# Patient Record
Sex: Female | Born: 1945 | Race: Black or African American | Hispanic: No | Marital: Married | Smoking: Never smoker
Health system: Southern US, Community
[De-identification: ages and names within clinical notes are randomized; demographics above are authoritative.]

## PROBLEM LIST (undated history)

## (undated) ENCOUNTER — Encounter: Attending: Hematology & Oncology | Primary: Hematology & Oncology

## (undated) ENCOUNTER — Ambulatory Visit: Payer: MEDICARE

## (undated) ENCOUNTER — Ambulatory Visit: Payer: MEDICARE | Attending: Hematology & Oncology | Primary: Hematology & Oncology

## (undated) ENCOUNTER — Telehealth

## (undated) ENCOUNTER — Encounter

## (undated) ENCOUNTER — Ambulatory Visit: Payer: MEDICARE | Attending: Nurse Practitioner | Primary: Nurse Practitioner

## (undated) ENCOUNTER — Encounter: Attending: Nurse Practitioner | Primary: Nurse Practitioner

## (undated) ENCOUNTER — Encounter
Attending: Student in an Organized Health Care Education/Training Program | Primary: Student in an Organized Health Care Education/Training Program

## (undated) ENCOUNTER — Ambulatory Visit: Payer: Medicare (Managed Care)

## (undated) ENCOUNTER — Ambulatory Visit

## (undated) ENCOUNTER — Telehealth: Attending: Hematology & Oncology | Primary: Hematology & Oncology

## (undated) ENCOUNTER — Encounter: Attending: Adult Health | Primary: Adult Health

## (undated) ENCOUNTER — Ambulatory Visit
Payer: MEDICARE | Attending: Student in an Organized Health Care Education/Training Program | Primary: Student in an Organized Health Care Education/Training Program

## (undated) ENCOUNTER — Ambulatory Visit: Payer: MEDICARE | Attending: Adult Health | Primary: Adult Health

## (undated) ENCOUNTER — Ambulatory Visit: Attending: Physician Assistant | Primary: Physician Assistant

## (undated) DIAGNOSIS — G8929 Other chronic pain: Secondary | ICD-10-CM

## (undated) DIAGNOSIS — E785 Hyperlipidemia, unspecified: Secondary | ICD-10-CM

## (undated) DIAGNOSIS — R42 Dizziness and giddiness: Secondary | ICD-10-CM

## (undated) DIAGNOSIS — K219 Gastro-esophageal reflux disease without esophagitis: Secondary | ICD-10-CM

## (undated) DIAGNOSIS — R002 Palpitations: Secondary | ICD-10-CM

## (undated) DIAGNOSIS — G2581 Restless legs syndrome: Secondary | ICD-10-CM

## (undated) DIAGNOSIS — I499 Cardiac arrhythmia, unspecified: Secondary | ICD-10-CM

## (undated) DIAGNOSIS — G629 Polyneuropathy, unspecified: Secondary | ICD-10-CM

## (undated) DIAGNOSIS — N3281 Overactive bladder: Secondary | ICD-10-CM

## (undated) DIAGNOSIS — I341 Nonrheumatic mitral (valve) prolapse: Secondary | ICD-10-CM

## (undated) DIAGNOSIS — G5600 Carpal tunnel syndrome, unspecified upper limb: Secondary | ICD-10-CM

## (undated) DIAGNOSIS — M5430 Sciatica, unspecified side: Secondary | ICD-10-CM

## (undated) DIAGNOSIS — I4891 Unspecified atrial fibrillation: Secondary | ICD-10-CM

## (undated) DIAGNOSIS — M549 Dorsalgia, unspecified: Secondary | ICD-10-CM

## (undated) HISTORY — DX: Palpitations: R00.2

## (undated) HISTORY — DX: Dizziness and giddiness: R42

## (undated) HISTORY — PX: CATARACT EXTRACTION: SUR2

## (undated) HISTORY — DX: Hyperlipidemia, unspecified: E78.5

## (undated) HISTORY — DX: Restless legs syndrome: G25.81

## (undated) HISTORY — DX: Overactive bladder: N32.81

## (undated) HISTORY — DX: Nonrheumatic mitral (valve) prolapse: I34.1

## (undated) HISTORY — DX: Polyneuropathy, unspecified: G62.9

## (undated) HISTORY — DX: Unspecified atrial fibrillation: I48.91

## (undated) HISTORY — DX: Other chronic pain: G89.29

## (undated) HISTORY — DX: Carpal tunnel syndrome, unspecified upper limb: G56.00

## (undated) HISTORY — DX: Sciatica, unspecified side: M54.30

## (undated) HISTORY — DX: Dorsalgia, unspecified: M54.9

## (undated) HISTORY — DX: Gastro-esophageal reflux disease without esophagitis: K21.9

---

## 1997-09-20 ENCOUNTER — Ambulatory Visit (HOSPITAL_COMMUNITY): Admission: RE | Admit: 1997-09-20 | Discharge: 1997-09-20 | Payer: Self-pay | Admitting: *Deleted

## 1997-09-21 ENCOUNTER — Ambulatory Visit (HOSPITAL_COMMUNITY): Admission: RE | Admit: 1997-09-21 | Discharge: 1997-09-21 | Payer: Self-pay | Admitting: *Deleted

## 1998-02-12 ENCOUNTER — Ambulatory Visit (HOSPITAL_COMMUNITY): Admission: RE | Admit: 1998-02-12 | Discharge: 1998-02-12 | Payer: Self-pay | Admitting: *Deleted

## 1998-02-13 ENCOUNTER — Ambulatory Visit (HOSPITAL_COMMUNITY): Admission: RE | Admit: 1998-02-13 | Discharge: 1998-02-13 | Payer: Self-pay | Admitting: *Deleted

## 1998-02-21 ENCOUNTER — Ambulatory Visit (HOSPITAL_COMMUNITY): Admission: RE | Admit: 1998-02-21 | Discharge: 1998-02-21 | Payer: Self-pay | Admitting: *Deleted

## 1999-09-25 ENCOUNTER — Ambulatory Visit (HOSPITAL_COMMUNITY): Admission: RE | Admit: 1999-09-25 | Discharge: 1999-09-25 | Payer: Self-pay | Admitting: *Deleted

## 1999-11-11 ENCOUNTER — Encounter: Payer: Self-pay | Admitting: *Deleted

## 1999-11-11 ENCOUNTER — Ambulatory Visit (HOSPITAL_COMMUNITY): Admission: RE | Admit: 1999-11-11 | Discharge: 1999-11-11 | Payer: Self-pay | Admitting: *Deleted

## 2000-01-07 ENCOUNTER — Other Ambulatory Visit: Admission: RE | Admit: 2000-01-07 | Discharge: 2000-01-07 | Payer: Self-pay | Admitting: Obstetrics and Gynecology

## 2000-06-10 ENCOUNTER — Encounter: Payer: Self-pay | Admitting: *Deleted

## 2000-06-10 ENCOUNTER — Ambulatory Visit (HOSPITAL_COMMUNITY): Admission: RE | Admit: 2000-06-10 | Discharge: 2000-06-10 | Payer: Self-pay | Admitting: *Deleted

## 2000-11-12 ENCOUNTER — Encounter: Payer: Self-pay | Admitting: *Deleted

## 2000-11-12 ENCOUNTER — Ambulatory Visit (HOSPITAL_COMMUNITY): Admission: RE | Admit: 2000-11-12 | Discharge: 2000-11-12 | Payer: Self-pay | Admitting: *Deleted

## 2001-03-10 ENCOUNTER — Other Ambulatory Visit: Admission: RE | Admit: 2001-03-10 | Discharge: 2001-03-10 | Payer: Self-pay | Admitting: Obstetrics and Gynecology

## 2001-05-10 ENCOUNTER — Ambulatory Visit (HOSPITAL_COMMUNITY): Admission: AD | Admit: 2001-05-10 | Discharge: 2001-05-10 | Payer: Self-pay | Admitting: *Deleted

## 2001-05-10 ENCOUNTER — Encounter: Payer: Self-pay | Admitting: *Deleted

## 2002-03-22 ENCOUNTER — Other Ambulatory Visit: Admission: RE | Admit: 2002-03-22 | Discharge: 2002-03-22 | Payer: Self-pay | Admitting: Obstetrics and Gynecology

## 2002-04-10 ENCOUNTER — Ambulatory Visit (HOSPITAL_COMMUNITY): Admission: RE | Admit: 2002-04-10 | Discharge: 2002-04-10 | Payer: Self-pay | Admitting: *Deleted

## 2002-04-10 ENCOUNTER — Encounter: Payer: Self-pay | Admitting: *Deleted

## 2003-01-08 ENCOUNTER — Other Ambulatory Visit: Admission: RE | Admit: 2003-01-08 | Discharge: 2003-01-08 | Payer: Self-pay | Admitting: Obstetrics and Gynecology

## 2004-02-04 ENCOUNTER — Other Ambulatory Visit: Admission: RE | Admit: 2004-02-04 | Discharge: 2004-02-04 | Payer: Self-pay | Admitting: Obstetrics and Gynecology

## 2004-06-03 ENCOUNTER — Ambulatory Visit: Admission: RE | Admit: 2004-06-03 | Discharge: 2004-06-03 | Payer: Self-pay | Admitting: Family Medicine

## 2005-02-09 ENCOUNTER — Other Ambulatory Visit: Admission: RE | Admit: 2005-02-09 | Discharge: 2005-02-09 | Payer: Self-pay | Admitting: Obstetrics and Gynecology

## 2006-02-18 ENCOUNTER — Other Ambulatory Visit: Admission: RE | Admit: 2006-02-18 | Discharge: 2006-02-18 | Payer: Self-pay | Admitting: Obstetrics and Gynecology

## 2009-04-05 ENCOUNTER — Encounter (INDEPENDENT_AMBULATORY_CARE_PROVIDER_SITE_OTHER): Payer: Self-pay | Admitting: Obstetrics and Gynecology

## 2009-04-05 ENCOUNTER — Ambulatory Visit (HOSPITAL_COMMUNITY): Admission: RE | Admit: 2009-04-05 | Discharge: 2009-04-05 | Payer: Self-pay | Admitting: Obstetrics and Gynecology

## 2009-10-14 ENCOUNTER — Emergency Department (HOSPITAL_COMMUNITY): Admission: EM | Admit: 2009-10-14 | Discharge: 2009-10-14 | Payer: Self-pay | Admitting: Emergency Medicine

## 2010-08-03 ENCOUNTER — Encounter: Payer: Self-pay | Admitting: Specialist

## 2010-10-17 LAB — CBC
HCT: 37.1 % (ref 36.0–46.0)
Hemoglobin: 12.7 g/dL (ref 12.0–15.0)
MCHC: 34.2 g/dL (ref 30.0–36.0)
MCV: 98.7 fL (ref 78.0–100.0)
Platelets: 199 10*3/uL (ref 150–400)
RBC: 3.76 MIL/uL — ABNORMAL LOW (ref 3.87–5.11)
RDW: 12.4 % (ref 11.5–15.5)
WBC: 4.5 10*3/uL (ref 4.0–10.5)

## 2010-11-19 ENCOUNTER — Other Ambulatory Visit: Payer: Self-pay | Admitting: Gastroenterology

## 2010-11-19 DIAGNOSIS — K219 Gastro-esophageal reflux disease without esophagitis: Secondary | ICD-10-CM

## 2010-11-19 DIAGNOSIS — R05 Cough: Secondary | ICD-10-CM

## 2010-11-24 ENCOUNTER — Other Ambulatory Visit: Payer: Self-pay

## 2011-04-13 HISTORY — PX: ATRIAL FLUTTER ABLATION: SHX5733

## 2011-05-05 ENCOUNTER — Emergency Department (HOSPITAL_COMMUNITY): Payer: Medicare Other

## 2011-05-05 ENCOUNTER — Inpatient Hospital Stay (HOSPITAL_COMMUNITY)
Admission: EM | Admit: 2011-05-05 | Discharge: 2011-05-10 | DRG: 251 | Disposition: A | Discharge status: Home / Self Care | Payer: Medicare Other | Attending: Cardiology | Admitting: Cardiology

## 2011-05-05 DIAGNOSIS — R002 Palpitations: Secondary | ICD-10-CM

## 2011-05-05 DIAGNOSIS — R5383 Other fatigue: Secondary | ICD-10-CM

## 2011-05-05 DIAGNOSIS — K219 Gastro-esophageal reflux disease without esophagitis: Secondary | ICD-10-CM | POA: Diagnosis present

## 2011-05-05 DIAGNOSIS — I4892 Unspecified atrial flutter: Principal | ICD-10-CM | POA: Diagnosis present

## 2011-05-05 DIAGNOSIS — Z7901 Long term (current) use of anticoagulants: Secondary | ICD-10-CM

## 2011-05-05 DIAGNOSIS — R5381 Other malaise: Secondary | ICD-10-CM

## 2011-05-05 LAB — BASIC METABOLIC PANEL
Calcium: 9.7 mg/dL (ref 8.4–10.5)
GFR calc Af Amer: 78 mL/min — ABNORMAL LOW (ref >90)
GFR calc non Af Amer: 67 mL/min — ABNORMAL LOW (ref >90)
Sodium: 137 mEq/L (ref 135–145)

## 2011-05-05 LAB — CBC
HCT: 34.6 % — ABNORMAL LOW (ref 36.0–46.0)
Hemoglobin: 12 g/dL (ref 12.0–15.0)
MCH: 33 pg (ref 26.0–34.0)
MCV: 95.1 fL (ref 78.0–100.0)
Platelets: 220 10*3/uL (ref 150–400)
RBC: 3.64 MIL/uL — ABNORMAL LOW (ref 3.87–5.11)

## 2011-05-05 LAB — URINALYSIS, ROUTINE W REFLEX MICROSCOPIC
Leukocytes, UA: NEGATIVE
Nitrite: NEGATIVE
Protein, ur: NEGATIVE mg/dL
Urobilinogen, UA: 0.2 mg/dL (ref 0.0–1.0)

## 2011-05-05 LAB — DIFFERENTIAL
Eosinophils Absolute: 0.1 10*3/uL (ref 0.0–0.7)
Lymphocytes Relative: 40 % (ref 12–46)
Lymphs Abs: 2.6 10*3/uL (ref 0.7–4.0)
Monocytes Relative: 8 % (ref 3–12)
Neutro Abs: 3.2 10*3/uL (ref 1.7–7.7)

## 2011-05-05 LAB — POCT I-STAT TROPONIN I: Troponin i, poc: 0 ng/mL (ref 0.00–0.08)

## 2011-05-05 MED ORDER — IOHEXOL 300 MG/ML  SOLN
100.0000 mL | Freq: Once | INTRAMUSCULAR | Status: AC | PRN
  Administered 2011-05-05: 100 mL via INTRAVENOUS

## 2011-05-06 DIAGNOSIS — I517 Cardiomegaly: Secondary | ICD-10-CM

## 2011-05-06 LAB — URINE CULTURE

## 2011-05-06 NOTE — H&P (Signed)
NAME:  Nancy Kline, Nancy Kline NO.:  [id number]  MEDICAL RECORD NO.:  [id number]  LOCATION:  [facility]                         FACILITY:  [facility]  PHYSICIAN:  Zacarias Pontes, MD       DATE OF BIRTH:  1945-11-07  DATE OF ADMISSION:  05/06/2011 DATE OF DISCHARGE:                             HISTORY & PHYSICAL   PRIMARY CARDIOLOGIST:  None established.  CHIEF COMPLAINT:  Palpitations and weakness.  HISTORY OF PRESENT ILLNESS:  Nancy Kline is a 65 year old woman with a reported history of atrial fibrillation in the remote past and mitral valve prolapse, who presents with 2-3 days of fatigue, exertional dyspnea, and palpitations.  Beginning Sunday, she began to feel more tired and breathless than usual with a few episodes of presyncope including early yesterday morning while ambulating to the bathroom.  She has generally felt fatigued and unable to exert herself.  She denies chest tightness, denies nausea, denies diaphoresis.  She has not had any fevers and denies any sick contacts.  She denies a cough productive or otherwise.  She denies any recent lower extremity swelling, pain, or erythema and has not had any recent trauma or surgery to Nancy Kline lower extremities.  She has also not been particularly sedentary preceding the onset of Nancy Kline symptoms.  PAST MEDICAL HISTORY: 1. Chronic back pain and sciatica. 2. Atrial fibrillation episode in the past, though she is not     chronically on anticoagulation. 3. Reported history of mitral valve prolapse.  SOCIAL HISTORY:  She is a lifelong nonsmoker.  She lives with Nancy Kline husband who accompanies Nancy Kline in the emergency room today.  The patient's daughter is a Teacher, early years/pre who I spoke with on the phone from the emergency room.  FAMILY HISTORY:  Noncontributory.  REVIEW OF SYSTEMS:  As per HPI otherwise is comprehensively negative.  ALLERGIES:  DARVOCET, LYRICA, CELEBREX, FLEXERIL.  MEDICATIONS: 1. Prilosec 20 mg daily. 2. Meloxicam 15 mg  daily. 3. Tramadol 50 mg daily.  PHYSICAL EXAMINATION:  VITAL SIGNS:  On physical exam, the patient has a heart rate of 85-95 with a blood pressure of 118/83, and respiratory rate of 16, oxygen saturations are 100% on room air. GENERAL:  She is in no acute distress and is not using any accessory muscles to assist Nancy Kline breathing. HEENT:  Notable for a supple neck with no masses or lymphadenopathy. Nancy Kline JVP is normal. CARDIAC:  Notable for a normal S1 and S2 with a regular rate and no murmurs, rubs, or gallops appreciated. LUNGS:  Clear to auscultation bilaterally with no crackles or wheezing appreciated. ABDOMEN:  Soft, nontender, nondistended with positive bowel sounds and no abdominal bruits. EXTREMITIES:  Warm and well perfused with no lower extremity edema.  DP pulses are 2+ bilaterally. NEUROLOGIC:  She is alert and oriented x3 with no focal neurologic deficits.  LABORATORY EVALUATION:  Nancy Kline white count is 6, Nancy Kline hematocrit is 34, platelets are 220,000.  Sodium 137, potassium 3.9, chloride 100, bicarb 28, BUN 14, creatinine 0.8 with a glucose of 102.  Nancy Kline calcium is 9.7, and Nancy Kline troponin is 0.  Nancy Kline urinalysis performed is negative.  A D-dimer was slightly elevated at 0.92.  INR  1.1.  An ECG demonstrates atrial flutter with variable block.  The flutter appears to be typical in nature with classic saw-tooth morphology in the inferior leads.  A CT of the chest with IV contrast was performed in the ER this evening with no evidence of pulmonary embolism.  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  PLANS:  We will admit Nancy Kline to telemetry for further evaluation and management.  She has been placed on enoxaparin at 1 mg/kg subcu every 12 hours for thromboembolic protection in the setting of atrial flutter. She will have a transthoracic echocardiogram in the morning to  assess Nancy Kline heart structure, Nancy Kline left atrial size, and Nancy Kline mitral valve.  I have initiated a low-dose beta-blocker for now.  I think EP could ablate Nancy Kline atrial flutter given its morphology.  A less definitive alternative would be to cardiovert Nancy Kline electively during the day tomorrow.  Either way, given the duration of Nancy Kline symptoms and the unknown period of time during which she has been in atrial flutter, she would likely need a transesophageal echocardiogram to evaluate Nancy Kline left atrial appendage prior to proceeding with either of these options.  Of note, the patient is very anxious about the possibility of any procedures and I explained to Nancy Kline the sequence and rationale for what we are going to do. We will further discuss Nancy Kline options with Nancy Kline in the light of day tomorrow.  I explained the plan to the patient and to Nancy Kline husband.  Both of them voiced understanding.  Questions were answered to the best of my ability.          ______________________________ Zacarias Pontes, MD     DM/MEDQ  D:  05/06/2011  T:  05/06/2011  Job:  [id number]  Electronically Signed by Zacarias Pontes MD on 05/06/2011 08:06:25 AM

## 2011-05-07 DIAGNOSIS — I4892 Unspecified atrial flutter: Secondary | ICD-10-CM

## 2011-05-08 LAB — GLUCOSE, CAPILLARY: Glucose-Capillary: 116 mg/dL — ABNORMAL HIGH (ref 70–99)

## 2011-05-08 LAB — BASIC METABOLIC PANEL
BUN: 14 mg/dL (ref 6–23)
Chloride: 102 mEq/L (ref 96–112)
GFR calc Af Amer: 81 mL/min — ABNORMAL LOW (ref >90)
Potassium: 4.2 mEq/L (ref 3.5–5.1)

## 2011-05-08 LAB — PROTIME-INR
INR: 1.35 (ref 0.00–1.49)
Prothrombin Time: 16.9 seconds — ABNORMAL HIGH (ref 11.6–15.2)

## 2011-05-08 LAB — DIFFERENTIAL
Basophils Absolute: 0 10*3/uL (ref 0.0–0.1)
Eosinophils Relative: 2 % (ref 0–5)
Lymphocytes Relative: 31 % (ref 12–46)

## 2011-05-08 LAB — CBC
HCT: 34.2 % — ABNORMAL LOW (ref 36.0–46.0)
Platelets: 208 10*3/uL (ref 150–400)
RDW: 12.1 % (ref 11.5–15.5)
WBC: 4.9 10*3/uL (ref 4.0–10.5)

## 2011-05-09 DIAGNOSIS — I4891 Unspecified atrial fibrillation: Secondary | ICD-10-CM

## 2011-05-09 LAB — PROTIME-INR
INR: 1.61 — ABNORMAL HIGH (ref 0.00–1.49)
Prothrombin Time: 19.4 seconds — ABNORMAL HIGH (ref 11.6–15.2)

## 2011-05-10 NOTE — Op Note (Signed)
NAMEDAYLA, GASCA NO.:  [id number]  MEDICAL RECORD NO.:  [id number]  LOCATION:  3742                         FACILITY:  [facility]  PHYSICIAN:  Doylene Canning. Ladona Ridgel, MD    DATE OF BIRTH:  1946/05/09  DATE OF PROCEDURE:  05/08/2011 DATE OF DISCHARGE:                              OPERATIVE REPORT   PROCEDURES PERFORMED: 1. Electrophysiologic study. 2. RF catheter ablation of atrial flutter.  INTRODUCTION:  The patient is a very pleasant 65 year old woman who has a history of palpitations in the past.  She presented to the hospital with atrial flutter and rapid ventricular response.  She underwent initiation of Coumadin.  She was placed on Lovenox.  She underwent transesophageal echo, demonstrating known left atrial appendage thrombus.  She is now referred for catheter ablation of atrial flutter.  DESCRIPTION OF PROCEDURE:  After informed consent was obtained, the patient was taken to the Diagnostic EP Lab in the fasting state.  The patient had previously returned to sinus rhythm approximately 12 hours prior to the procedure.  A 6-French Octapolar catheter was inserted percutaneously into the right femoral vein and advanced to the coronary sinus.  A 6-French quadripolar catheter was inserted percutaneously into the right femoral vein and advanced to the His bundle region.  At this point, programmed atrial stimulation was carried out from the coronary sinus and stepwise decreased down to 500/310, resulting in AV node ERP. During programmed atrial stimulation, there were multiple AH jumps, no echo beats, and no inducible SVT.  Next, rapid atrial pacing was carried out from the coronary sinus at a base drive cycle length of 161 msec and stepwise decreased down to 430 msec where AV Wenckebach was demonstrated.  During rapid atrial pacing, the PR interval was equal to the RR interval.  The patient had a cycle length of 200 msec, resulted in the initiation of atrial  flutter.  Mapping was then carried out. This was typical counterclockwise tricuspid annular reentry.  Lead V1 was positive.  Inferior leads were negative in a saw tooth pattern.  A 7- French quadripolar ablation catheter was then maneuvered by way of the right femoral vein into the atrial flutter isthmus.  A total of 10 RF energy applications were delivered.  During the second RF energy application, atrial flutter was terminated, and sinus rhythm restored. Pacing was then carried out from the coronary sinus.  Seven additional RF energy applications were delivered, resulting in the creation of the atrial flutter isthmus block.  Single bonus RF energy application was delivered and the patient was observed for approximately 30 minutes. During this time, there is no atrial flutter isthmus conduction.  During this time, the ablation catheter was maneuvered into the right ventricle and rapid ventricular pacing was carried out from the right ventricle, demonstrating VA Wenckebach at 600 milliseconds.  Programmed ventricular stimulation was carried out at a base drive cycle length of 096 milliseconds.  The S1 and S2 interval stepwise decreased down to 490 milliseconds where the retrograde AV node ERP was demonstrated.  During programmed ventricular stimulation, there was no inducible VT.  There was no inducible SVT.  At this point, the catheters were removed.  Hemostasis was assured and the patient was returned to her room in satisfactory condition.  COMPLICATIONS:  There were no immediate procedure complications.  RESULTS:  A.  Baseline ECG demonstrates sinus rhythm with normal axis and intervals. B.  Baseline intervals.  Sinus node cycle length was 840 milliseconds, the HV interval was 34 milliseconds, the QRS duration was 74 milliseconds, the AH interval was 69 milliseconds.  Following ablation, there was no significant change. C.  Rapid ventricular pacing.  Following ablation of rapid  ventricular pacing, demonstrated a VA Wenckebach cycle length of 600 milliseconds. During rapid ventricular pacing, the atrial activation was midline and decremental. D.  Programmed ventricular stimulation.  Programmed ventricular stimulation was carried out from the right ventricle at base drive cycle length of 409 milliseconds.  The S1 and S2 interval stepwise decreased down to 490 milliseconds, resulting in the retrograde AV node ERP. During programmed ventricular stimulation, there are no inducible VT or SVT. E.  Rapid atrial pacing.  Rapid atrial pacing was carried out from the coronary sinus, demonstrating AV Wenckebach cycle length of 430 milliseconds. F.  Programmed atrial stimulation.  Programmed atrial stimulation was carried out from the coronary sinus at base drive cycle length of 811 milliseconds.  The S1-S2 interval stepwise decreased down to 310 milliseconds where the AV node ERP was observed.  During programmed atrial stimulation, there was multiple AH jumps, no echo beats, and no inducible SVT. G.  Arrhythmias observed. 1. Atrial flutter.  Initiation was with rapid atrial pacing.  Duration     was sustained.  Termination was with catheter ablation.  Cycle     length was 207 milliseconds.     a.     Mapping.  Mapping of atrial flutter isthmus demonstrated      usual size and orientation.     b.     RF energy application.  A total of 10 RF energy applications      were delivered, resulting in termination of atrial flutter,      restoration of sinus rhythm, creation of bidirectional block, and      atrial flutter isthmus.  CONCLUSION:  Study demonstrates successful electrophysiologic study and RF catheter ablation of typical atrial flutter with a total 10 RF energy applications, resulting in termination of flutter, restoration of sinus rhythm, creation of bidirectional block and atrial flutter isthmus.     Doylene Canning. Ladona Ridgel, MD     GWT/MEDQ  D:  05/08/2011  T:   05/09/2011  Job:  [id number]  Electronically Signed by Lewayne Bunting MD on 05/10/2011 12:23:26 PM

## 2011-05-12 ENCOUNTER — Ambulatory Visit (INDEPENDENT_AMBULATORY_CARE_PROVIDER_SITE_OTHER): Payer: Medicare Other | Admitting: *Deleted

## 2011-05-12 DIAGNOSIS — I4892 Unspecified atrial flutter: Secondary | ICD-10-CM | POA: Insufficient documentation

## 2011-05-12 DIAGNOSIS — Z7901 Long term (current) use of anticoagulants: Secondary | ICD-10-CM | POA: Insufficient documentation

## 2011-05-12 NOTE — Patient Instructions (Signed)

## 2011-05-20 ENCOUNTER — Ambulatory Visit (INDEPENDENT_AMBULATORY_CARE_PROVIDER_SITE_OTHER): Payer: Medicare Other | Admitting: Emergency Medicine

## 2011-05-20 DIAGNOSIS — I4892 Unspecified atrial flutter: Secondary | ICD-10-CM

## 2011-05-20 DIAGNOSIS — Z7901 Long term (current) use of anticoagulants: Secondary | ICD-10-CM

## 2011-05-23 NOTE — Discharge Summary (Signed)
Nancy Kline, Nancy Kline NO.:  [id number]  MEDICAL RECORD NO.:  [id number]  LOCATION:  3742                         FACILITY:  [facility]  PHYSICIAN:  Luis Abed, MD, FACCDATE OF BIRTH:  11/02/1945  DATE OF ADMISSION:  05/05/2011 DATE OF DISCHARGE:  05/09/2011                              DISCHARGE SUMMARY   CARDIOLOGIST:  Doylene Canning. Ladona Ridgel, MD  PRIMARY CARE PHYSICIAN:  Tally Joe, MD  REASON FOR ADMISSION:  Atrial flutter.  DISCHARGE DIAGNOSES: 1. Symptomatic atrial flutter.     a.     Converted to sinus rhythm this admission.     b.     Status post radiofrequency catheter ablation by [name]      this admission. 2. Abnormal chest CT. 3. Chronic back pain and sciatica. 4. Prior reported history of mitral valve prolapse. 5. Gastroesophageal reflux disease.  PROCEDURE PERFORMED THIS ADMISSION: 1. EPS and radiofrequency catheter ablation by Dr. Lewayne Bunting on     May 08, 2011: This demonstrated successful ablation of     inducible atrial flutter. 2. Transesophageal echocardiogram May 07, 2011:  LVEF appeared     mildly depressed, no evidence of thrombus in the atrial cavity or     LAA.  No PFO.  STUDIES: 1. A 2D echocardiogram May 06, 2011:  Mild LVH, EF 55-60%. 2. CT angiogram of the chest May 05, 2011:  Mild to moderate.     Motion degraded exam; no large/central pulmonary embolism; tiny     nodules, likely subpleural lymph nodes-if the patient at high risk     for bronchogenic carcinoma, followup chest CT in 1 year     recommended, if low risk, no followup needed.  HOSPITAL COURSE:  Nancy Kline is a 65 year old female patient who presented to the [facility] Emergency room on the date of admission with complaints of weakness, dyspnea, and palpitations.  She was noted to be in atrial flutter with controlled ventricular rate upon presentation. She was admitted for further evaluation and treatment.  HOSPITAL COURSE:  The patient was  noted to have an elevated D-dimer in the emergency room.  She had a CT angiogram that was negative for pulmonary embolism.  There were tiny nodules noted on chest CT and she may need followup CT in the next year which can be done with her primary care physician.  She was seen by [name] for EP consultation.  She was placed on Lovenox and Coumadin.  Echocardiogram was obtained with results noted above.  Risks and benefits of proceeding with transesophageal echocardiogram guided atrial flutter ablation were discussed with the patient.  She agreed to proceed.  Transesophageal echocardiogram demonstrated no left atrial appendage.  She did convert to sinus rhythm on her own.  She still desired to undergo radiofrequency catheter ablation.  This was performed on October 26 and was successful. On the morning of May 09, 2011, she was ready for discharge to home. However, her INR was only 1.6 and she was not comfortable giving herself Lovenox shots at home.  She was kept 1 more day and her INR was 1.97 on October 28.  She was evaluated by Dr.  Myrtis Ser and felt stable enough for discharge to home.  She will need close followup in [facility] next week and will follow up with [name].  She can follow up with her primary care physician for her abnormal chest CT.  LABS AND ANCILLARY DATA:  Hemoglobin 11.4, at discharge INR 1.97, potassium 4.2, creatinine 0.85.  D-dimer on admission 0.92.  Chest x- ray, no acute cardiopulmonary disease.  DISCHARGE MEDICATIONS: 1. Metoprolol 25 mg twice a day. 2. Warfarin 5 mg daily. 3. Calcium 2 tablets daily. 4. Fish oil daily. 5. Glucosamine daily. 6. Lidoderm patch daily. 7. Magnesium oxide 2 tablets daily. 8. Prilosec 20 mg daily. 9. Tramadol 50 mg daily. 10.Tylenol Extra Strength 500 mg 2 tablets 3 times a day as needed. 11.Vitamin D3 1000 units 2 capsules daily. 12.She has been advised to hold from meloxicam for now as well as her     Motrin  given initiation of Coumadin.  She can discuss therapies     with her primary care physician.  ALLERGIES:  FLEXERIL, DARVOCET, LYRICA, CELEBREX, VICODIN/LORTAB/NORCO/PERCOCET/TYLOX/ROXICET.  ACTIVITY:  She is to increase her activity slowly.  DIET:  Low-fat, low-sodium diet.  WOUND CARE:  She should call our office for any swelling, bleeding, bruising, or fever.  FOLLOWUP: 1. To the Coumadin Clinic by Tuesday, October 30 for followup on her     INR. 2. She will see [name] in the next 2-4 weeks and the office will     contact her with an appointment for both Dr.     Ladona Ridgel and the Coumadin Clinic. 3. She should see her primary care physician within the next several     weeks to discuss her abnormal chest CT.  Total physician and PA time greater than 30 minutes since discharge.     Tereso Newcomer, PA-C   ______________________________ Luis Abed, MD, [facility]    SW/MEDQ  D:  05/10/2011  T:  05/10/2011  Job:  [id number]  cc:   Tally Joe, M.D.  Electronically Signed by Tereso Newcomer PA-C on 05/16/2011 08:21:56 PM Electronically Signed by Willa Rough MD FACC on 05/23/2011 06:33:04 AM

## 2011-05-27 ENCOUNTER — Ambulatory Visit (INDEPENDENT_AMBULATORY_CARE_PROVIDER_SITE_OTHER): Payer: Medicare Other | Admitting: Emergency Medicine

## 2011-05-27 DIAGNOSIS — I4892 Unspecified atrial flutter: Secondary | ICD-10-CM

## 2011-05-27 DIAGNOSIS — Z7901 Long term (current) use of anticoagulants: Secondary | ICD-10-CM

## 2011-06-03 ENCOUNTER — Ambulatory Visit (INDEPENDENT_AMBULATORY_CARE_PROVIDER_SITE_OTHER): Payer: Medicare Other | Admitting: Emergency Medicine

## 2011-06-03 DIAGNOSIS — Z7901 Long term (current) use of anticoagulants: Secondary | ICD-10-CM

## 2011-06-03 DIAGNOSIS — I4892 Unspecified atrial flutter: Secondary | ICD-10-CM

## 2011-06-08 ENCOUNTER — Encounter: Payer: Self-pay | Admitting: *Deleted

## 2011-06-09 ENCOUNTER — Ambulatory Visit (INDEPENDENT_AMBULATORY_CARE_PROVIDER_SITE_OTHER): Payer: Medicare Other | Admitting: Internal Medicine

## 2011-06-09 ENCOUNTER — Encounter: Payer: Self-pay | Admitting: Internal Medicine

## 2011-06-09 ENCOUNTER — Telehealth: Payer: Self-pay | Admitting: Internal Medicine

## 2011-06-09 VITALS — BP 110/63 | HR 63 | Ht 67.0 in | Wt 191.8 lb

## 2011-06-09 DIAGNOSIS — I4892 Unspecified atrial flutter: Secondary | ICD-10-CM

## 2011-06-09 DIAGNOSIS — I739 Peripheral vascular disease, unspecified: Secondary | ICD-10-CM | POA: Insufficient documentation

## 2011-06-09 NOTE — Telephone Encounter (Signed)
This medication is not on her list.  Per Dr Ladona Ridgel okay  to stop Metoprolol  Tried to call patient but did not get an answer

## 2011-06-09 NOTE — Patient Instructions (Signed)
Your physician has requested that you have a lower extremity arterial exercise duplex. During this test, exercise and ultrasound are used to evaluate arterial blood flow in the legs. Allow one hour for this exam. There are no restrictions or special instructions.  Your physician has recommended you make the following change in your medication:  1) Stop Warfarin

## 2011-06-09 NOTE — Assessment & Plan Note (Signed)
It is unclear to me whether the pain in her legs when she walks is due to peripheral vascular disease or arthritis. It is worse with exertion and better with rest. We'll plan to obtain duplex Dopplers of her lower extremities to further quantitate the blood flow in her legs. I did not detect a bruit or reduced pulsations in her feet.

## 2011-06-09 NOTE — Telephone Encounter (Signed)
New problem Pt was here today.  She wants to know about metoprolol Does she stop it too? Please call her back

## 2011-06-09 NOTE — Progress Notes (Signed)
HPI Nancy Kline returns today for followup. She is a very pleasant 65 year old woman with a history of atrial flutter status post catheter ablation. Prior to her ablation, she had been placed on warfarin and has continued that for the last month. The patient has no significant tachycardia palpitations. She does note pain in her legs when she walks and wonders if she has peripheral vascular disease. She denies shortness of breath or peripheral edema. She also has a history of arthritis and has been followed by an orthopedic surgeon. Allergies  Allergen Reactions  . Celebrex (Celecoxib)   . Darvocet (Propoxyphene N-Acetaminophen)   . Flexeril (Cyclobenzaprine Hcl)   . Lyrica      Current Outpatient Prescriptions  Medication Sig Dispense Refill  . cholecalciferol (VITAMIN D) 1000 UNITS tablet Take 1,000 Units by mouth daily.        . fish oil-omega-3 fatty acids 1000 MG capsule Take 2 g by mouth daily.        Marland Kitchen glucosamine-chondroitin 500-400 MG tablet Take 1 tablet by mouth daily.        . magnesium 30 MG tablet Take 30 mg by mouth daily.        . traMADol (ULTRAM) 50 MG tablet Take 50 mg by mouth every 6 (six) hours as needed. Maximum dose= 8 tablets per day       . warfarin (COUMADIN) 5 MG tablet Take 5 mg by mouth as directed.           Past Medical History  Diagnosis Date  . Palpitations   . Atrial fibrillation   . Mitral valve prolapse   . Chronic back pain   . Sciatica     ROS:   All systems reviewed and negative except as noted in the HPI.   No past surgical history on file.   No family history on file.   History   Social History  . Marital Status: Married    Spouse Name: N/A    Number of Children: N/A  . Years of Education: N/A   Occupational History  . Not on file.   Social History Main Topics  . Smoking status: Never Smoker   . Smokeless tobacco: Never Used  . Alcohol Use: No  . Drug Use: Not on file  . Sexually Active: Not on file   Other Topics  Concern  . Not on file   Social History Narrative  . No narrative on file     BP 110/63  Pulse 63  Ht 5\' 7"  (1.702 m)  Wt 87 kg (191 lb 12.8 oz)  BMI 30.04 kg/m2  Physical Exam:  Well appearing NAD HEENT: Unremarkable Neck:  No JVD, no thyromegally Lymphatics:  No adenopathy Back:  No CVA tenderness Lungs:  Clear with no wheezes, rales, or rhonchi. HEART:  Regular rate rhythm, no murmurs, no rubs, no clicks Abd:  soft, positive bowel sounds, no organomegally, no rebound, no guarding Ext:  2 plus pulses, no edema, no cyanosis, no clubbing Skin:  No rashes no nodules Neuro:  CN II through XII intact, motor grossly intact  EKG Normal sinus rhythm  Assess/Plan:

## 2011-06-09 NOTE — Assessment & Plan Note (Signed)
She appears to be maintaining sinus rhythm. I've asked the patient to stop her anticoagulation. Her diet can be normalized and she is allowed the green vegetables.

## 2011-06-10 ENCOUNTER — Encounter: Payer: Medicare Other | Admitting: Emergency Medicine

## 2011-06-10 NOTE — Telephone Encounter (Signed)
Spoke with patient per Dr Lubertha Basque recommendations she is going to decrease her Metoprolol dose to 25mg  daily for one week then stop  She will call me if there are any problems

## 2011-06-16 ENCOUNTER — Encounter (INDEPENDENT_AMBULATORY_CARE_PROVIDER_SITE_OTHER): Payer: Medicare Other | Admitting: *Deleted

## 2011-06-16 DIAGNOSIS — I739 Peripheral vascular disease, unspecified: Secondary | ICD-10-CM

## 2011-06-24 ENCOUNTER — Other Ambulatory Visit: Payer: Self-pay | Admitting: Specialist

## 2011-06-24 DIAGNOSIS — M545 Low back pain: Secondary | ICD-10-CM

## 2011-06-25 ENCOUNTER — Other Ambulatory Visit: Payer: Medicare Other

## 2011-06-30 ENCOUNTER — Ambulatory Visit
Admission: RE | Admit: 2011-06-30 | Discharge: 2011-06-30 | Disposition: A | Discharge status: Home / Self Care | Payer: Medicare Other | Source: Ambulatory Visit | Attending: Specialist | Admitting: Specialist

## 2011-06-30 ENCOUNTER — Other Ambulatory Visit: Payer: Medicare Other

## 2011-06-30 DIAGNOSIS — M545 Low back pain: Secondary | ICD-10-CM

## 2012-01-04 ENCOUNTER — Ambulatory Visit: Payer: Self-pay | Admitting: Pharmacist

## 2012-01-04 DIAGNOSIS — I4892 Unspecified atrial flutter: Secondary | ICD-10-CM

## 2012-01-04 DIAGNOSIS — Z7901 Long term (current) use of anticoagulants: Secondary | ICD-10-CM

## 2012-05-03 ENCOUNTER — Other Ambulatory Visit: Payer: Self-pay | Admitting: Family Medicine

## 2012-05-03 ENCOUNTER — Ambulatory Visit
Admission: RE | Admit: 2012-05-03 | Discharge: 2012-05-03 | Disposition: A | Discharge status: Home / Self Care | Payer: Medicare Other | Source: Ambulatory Visit | Attending: Family Medicine | Admitting: Family Medicine

## 2012-05-03 DIAGNOSIS — R0602 Shortness of breath: Secondary | ICD-10-CM

## 2012-10-28 ENCOUNTER — Encounter (HOSPITAL_COMMUNITY): Payer: Self-pay | Admitting: *Deleted

## 2012-10-28 DIAGNOSIS — T380X5A Adverse effect of glucocorticoids and synthetic analogues, initial encounter: Secondary | ICD-10-CM | POA: Insufficient documentation

## 2012-10-28 DIAGNOSIS — G8929 Other chronic pain: Secondary | ICD-10-CM | POA: Insufficient documentation

## 2012-10-28 DIAGNOSIS — R51 Headache: Secondary | ICD-10-CM | POA: Insufficient documentation

## 2012-10-28 DIAGNOSIS — Z8739 Personal history of other diseases of the musculoskeletal system and connective tissue: Secondary | ICD-10-CM | POA: Insufficient documentation

## 2012-10-28 DIAGNOSIS — R42 Dizziness and giddiness: Secondary | ICD-10-CM | POA: Insufficient documentation

## 2012-10-28 DIAGNOSIS — Z79899 Other long term (current) drug therapy: Secondary | ICD-10-CM | POA: Insufficient documentation

## 2012-10-28 DIAGNOSIS — Z8679 Personal history of other diseases of the circulatory system: Secondary | ICD-10-CM | POA: Insufficient documentation

## 2012-10-28 DIAGNOSIS — M5380 Other specified dorsopathies, site unspecified: Secondary | ICD-10-CM | POA: Insufficient documentation

## 2012-10-28 NOTE — ED Notes (Signed)
taking prednisone and lidoderm patches for low back, c/o head constricting from neck up, heart racing, face swelling, dizziness, sudden need to void. Sx sudden onset around 2100. Also back spasms for 2d. started prednisone Tuesday. Has taken prednisone before without reaction.

## 2012-10-29 ENCOUNTER — Emergency Department (HOSPITAL_COMMUNITY)
Admission: EM | Admit: 2012-10-29 | Discharge: 2012-10-29 | Disposition: A | Discharge status: Home / Self Care | Payer: Medicare Other | Attending: Emergency Medicine | Admitting: Emergency Medicine

## 2012-10-29 ENCOUNTER — Emergency Department (HOSPITAL_COMMUNITY): Payer: Medicare Other

## 2012-10-29 DIAGNOSIS — M62838 Other muscle spasm: Secondary | ICD-10-CM

## 2012-10-29 NOTE — ED Notes (Signed)
CT Results reviewed.

## 2012-10-29 NOTE — ED Notes (Addendum)
Back from CT via w/c, alert, NAD, calm 

## 2012-10-29 NOTE — ED Provider Notes (Signed)
History     CSN: [id number]  Arrival date & time 10/28/12  2246   First MD Initiated Contact with Patient 10/29/12 0028      Chief Complaint  Patient presents with  . Medication Reaction   HPI  History provided by the patient. Patient is a 67 year old female with past history of atrial fibrillation and chronic back pain who presents with concerns for possible adverse medication reaction. Patient complains of sudden low back pain and spasming as well as complaints of headache while sitting at church service prior to arrival. Patient states that she was recently started on prednisone to help with her chronic low back pain symptoms as well as her bilateral carpal condyle. Patient regularly uses lidocaine topical patches over her low back to help with pain and has also been using these. She began using prednisone 3 days ago but states she she was taking half the dose prescribed. This evening she experienced sudden sharp pains shooting from her right low back into her buttocks and upper spine with tightness. Pain was worse with any kind of movement and especially with sitting position. Patient also reports experiencing a sharp type pain to the back of her neck radiating up in a bandlike fashion across her for head. Speech and went to the bathroom but could not take the pain from her symptoms. She did remove her lidocaine patches and gradually symptoms seemed to improve. She was encouraged by other church members to come to the emergency room for evaluation. Upon arriving to the emergency room she reports near complete relief of symptoms including headache. She decided to continue to be seen to find out if this could be related to her prednisone medications. In general patient reports improved overall pains after starting prednisone 2 days ago. She did experience slight sleep disturbance the first night of taking medication. She denies having any tremor or jitteriness. Denies any other associated  symptoms.    Past Medical History  Diagnosis Date  . Palpitations   . Atrial fibrillation   . Mitral valve prolapse   . Chronic back pain   . Sciatica     History reviewed. No pertinent past surgical history.  History reviewed. No pertinent family history.  History  Substance Use Topics  . Smoking status: Never Smoker   . Smokeless tobacco: Never Used  . Alcohol Use: No    OB History   Grav Para Term Preterm Abortions TAB SAB Ect Mult Living                  Review of Systems  Constitutional: Negative for fever and chills.  Respiratory: Negative for shortness of breath.   Cardiovascular: Negative for chest pain.  Gastrointestinal: Negative for nausea and vomiting.  Musculoskeletal: Positive for back pain.  Neurological: Positive for light-headedness and headaches. Negative for tremors, speech difficulty, weakness and numbness.  All other systems reviewed and are negative.    Allergies  Celebrex; Darvocet; Flexeril; and Pregabalin  Home Medications   Current Outpatient Rx  Name  Route  Sig  Dispense  Refill  . BIOTIN PO   Oral   Take 1 tablet by mouth daily.         . cholecalciferol (VITAMIN D) 1000 UNITS tablet   Oral   Take 1,000 Units by mouth daily.           . fish oil-omega-3 fatty acids 1000 MG capsule   Oral   Take 2 g by mouth daily.           Marland Kitchen  glucosamine-chondroitin 500-400 MG tablet   Oral   Take 1 tablet by mouth daily.           Marland Kitchen lidocaine (LIDODERM) 5 %   Transdermal   Place 2 patches onto the skin daily. Remove & Discard patch within 12 hours or as directed by MD         . magnesium 30 MG tablet   Oral   Take 30 mg by mouth daily.           . Multiple Vitamin (MULTIVITAMIN WITH MINERALS) TABS   Oral   Take 1 tablet by mouth daily.         . predniSONE (STERAPRED UNI-PAK) 5 MG TABS   Oral   Take 5-60 mg by mouth daily. Tapered dose           BP 173/69  Pulse 76  Temp(Src) 98.7 F (37.1 C) (Oral)   Resp 16  SpO2 100%  Physical Exam  Nursing note and vitals reviewed. Constitutional: She is oriented to person, place, and time. She appears well-developed and well-nourished. No distress.  HENT:  Head: Normocephalic and atraumatic.  Eyes: Conjunctivae and EOM are normal. Pupils are equal, round, and reactive to light.  Neck: Normal range of motion. Neck supple.  No meningeal signs  Cardiovascular: Normal rate and regular rhythm.   No murmur heard. Pulmonary/Chest: Effort normal and breath sounds normal. No respiratory distress. She has no wheezes. She has no rales.  Abdominal: Soft. There is no tenderness. There is no rigidity, no rebound, no guarding, no CVA tenderness and no tenderness at McBurney's point.  Neurological: She is alert and oriented to person, place, and time. She has normal strength. No cranial nerve deficit or sensory deficit. Gait normal.  Skin: Skin is warm and dry. No rash noted.  Psychiatric: She has a normal mood and affect. Her behavior is normal.    ED Course  Procedures   Ct Head Wo Contrast  10/29/2012  *RADIOLOGY REPORT*  Clinical Data: Severe headache and dizziness.  CT HEAD WITHOUT CONTRAST  Technique:  Contiguous axial images were obtained from the base of the skull through the vertex without contrast.  Comparison: None.  Findings: There is no evidence of acute infarction, mass lesion, or intra- or extra-axial hemorrhage on CT.  A cavum septum pellucidum is noted.  Calcification is seen at the basal ganglia.  The posterior fossa, including the cerebellum, brainstem and fourth ventricle, is within normal limits.  The third and lateral ventricles are unremarkable in appearance.  The cerebral hemispheres are symmetric in appearance, with normal gray- white differentiation.  No mass effect or midline shift is seen.  There is no evidence of fracture; visualized osseous structures are unremarkable in appearance.  The visualized portions of the orbits are within  normal limits.  The paranasal sinuses and mastoid air cells are well-aerated.  No significant soft tissue abnormalities are seen.  There is incomplete fusion of the posterior arch of C1.  IMPRESSION: Unremarkable noncontrast CT of the head.   Original Report Authenticated By: Tonia Ghent, M.D.      1. Muscle spasm       MDM  1:40PM patient seen and evaluated. Patient sitting comfortably appears well no acute distress. Patient with normal nonfocal neuro exam.   Patient continues to be well without significant complaints. Head CT unremarkable. She continues to be comfortable without back pain or spasm. At this time patient to able to return home and followup with her PCP  Angus Seller, PA-C 10/29/12 [phone]  Medical screening examination/treatment/procedure(s) were performed by non-physician practitioner and as supervising physician I was immediately available for consultation/collaboration.  Derwood Kaplan, MD 10/30/12 [phone]

## 2012-10-29 NOTE — ED Notes (Signed)
Pt states that she feels that her symptoms have resolved since 2100.  Pt states she is still slightly dizzy but denies any neck constriction, sob, cp, facial swelling.

## 2012-11-15 ENCOUNTER — Ambulatory Visit (INDEPENDENT_AMBULATORY_CARE_PROVIDER_SITE_OTHER): Payer: 59 | Admitting: Diagnostic Neuroimaging

## 2012-11-15 ENCOUNTER — Encounter: Payer: Self-pay | Admitting: Diagnostic Neuroimaging

## 2012-11-15 VITALS — BP 114/60 | HR 71 | Temp 98.5°F | Ht 67.0 in | Wt 194.0 lb

## 2012-11-15 DIAGNOSIS — R51 Headache: Secondary | ICD-10-CM

## 2012-11-15 DIAGNOSIS — R519 Headache, unspecified: Secondary | ICD-10-CM | POA: Insufficient documentation

## 2012-11-15 DIAGNOSIS — R42 Dizziness and giddiness: Secondary | ICD-10-CM

## 2012-11-15 NOTE — Progress Notes (Signed)
[facility]  PATIENT: Nancy Kline DOB: 02-11-1946  REFERRING CLINICIAN: Dr. Tally Joe HISTORY FROM: patient REASON FOR VISIT: headaches   HISTORICAL  CHIEF COMPLAINT:  Chief Complaint  Patient presents with  . Headache    HISTORY OF PRESENT ILLNESS: 67 yo female who was seen in the ER on 10/28/12 with low back pain  And worst headache of her life, 10/10 pain.  Pain was described as "crawling" pain across her low back and then a tight, constricting feeling that started at the base of her head in the back and moved up around her head until she felt like her head was going to explode.  Noncontrast head CT was unremarkable.  Symptoms were thought to possibly correlate with Lidoderm patch that she was wearing at time of severe back/head pain.  She was also taking prednisone at the time.  Patient has multiple medication allergies.    She does not have a long headache history. Dull headache occurred on 09/10/2012 accompanied with nausea and vomiting within an hour.  Like episode occurred on 09/26/2011.  Since then patient states that she has a dull headache with 2/10 pain.  Denies one-sided pain or throbbing pain, photophobia or sensitivity to sound associated with headache. Patient states she has had vertigo in the past and PCP prescribed Meclizine after the two episodes in March.  Patient states she feels pretty good today, she just wants to know "what is going on" with her head.  REVIEW OF SYSTEMS: Full 14 system review of systems performed and notable only for watery eyes, headache, joint pain, and aching muscles.  ALLERGIES: Allergies  Allergen Reactions  . Celebrex (Celecoxib) Other (See Comments)    hallucinations  . Darvocet (Propoxyphene-Acetaminophen) Other (See Comments)    hallucinations  . Flexeril (Cyclobenzaprine Hcl) Other (See Comments)    hallucinations  . Pregabalin Other (See Comments)    hallucinations    HOME MEDICATIONS: Outpatient  Prescriptions Prior to Visit  Medication Sig Dispense Refill  . BIOTIN PO Take 1 tablet by mouth daily.      . cholecalciferol (VITAMIN D) 1000 UNITS tablet Take 1,000 Units by mouth daily.        . fish oil-omega-3 fatty acids 1000 MG capsule Take 2 g by mouth daily.        Marland Kitchen glucosamine-chondroitin 500-400 MG tablet Take 1 tablet by mouth daily.        Marland Kitchen lidocaine (LIDODERM) 5 % Place 2 patches onto the skin daily. Remove & Discard patch within 12 hours or as directed by MD      . magnesium 30 MG tablet Take 30 mg by mouth daily.        . meclizine (ANTIVERT) 50 MG tablet Take 50 mg by mouth 3 (three) times daily as needed.      . Multiple Vitamin (MULTIVITAMIN WITH MINERALS) TABS Take 1 tablet by mouth daily.      . predniSONE (STERAPRED UNI-PAK) 5 MG TABS Take 5-60 mg by mouth daily. Tapered dose       No facility-administered medications prior to visit.    PAST MEDICAL HISTORY: Past Medical History  Diagnosis Date  . Palpitations   . Atrial fibrillation   . Mitral valve prolapse   . Chronic back pain   . Sciatica     PAST SURGICAL HISTORY: Past Surgical History  Procedure Laterality Date  . Cardiac surgery      FAMILY HISTORY: History reviewed. No pertinent family history.  SOCIAL  HISTORY:  History   Social History  . Marital Status: Married    Spouse Name: Tomasa Rand Sr.     Number of Children: 2  . Years of Education: BS   Occupational History  . Retired    Social History Main Topics  . Smoking status: Never Smoker   . Smokeless tobacco: Never Used  . Alcohol Use: No  . Drug Use: No  . Sexually Active: Not on file   Other Topics Concern  . Not on file   Social History Narrative   [name]   Caffeine Use: 1 cup of coffee monthly.      PHYSICAL EXAM  Filed Vitals:   11/15/12 1130  BP: 114/60  Pulse: 71  Temp: 98.5 F (36.9 C)  TempSrc: Oral  Height: 5\' 7"  (1.702 m)  Weight: 194 lb (87.998 kg)   Body mass  index is 30.38 kg/(m^2).  GENERAL EXAM: Patient is in no distress; Gilberto Better negative for nystagmus and did not provoke dizziness or nausea.  CARDIOVASCULAR: Regular rate and rhythm, no murmurs, no carotid bruits  NEUROLOGIC: MENTAL STATUS: awake, alert, language fluent, comprehension intact, naming intact CRANIAL NERVE: no papilledema on fundoscopic exam, pupils equal and reactive to light, visual fields full to confrontation, extraocular muscles intact, no nystagmus, facial sensation and strength symmetric, uvula midline, shoulder shrug symmetric, tongue midline. PAIN WITH EXTENSION OF NECK; NECK FLEXION NORMAL. MOTOR: normal bulk and tone, full strength in the BUE, BLE SENSORY: normal and symmetric to light touch, pinprick, temperature, vibration and proprioception COORDINATION: finger-nose-finger, fine finger movements normal REFLEXES: BUE 3, RIGHT KNEE 1, LEFT KNEE 2, ANKLES 1.  GAIT/STATION: narrow based gait; able to walk on toes, heels and tandem; romberg is negative   DIAGNOSTIC DATA (LABS, IMAGING, TESTING) - I reviewed patient records, labs, notes, testing and imaging myself where available.  Lab Results  Component Value Date   WBC 4.9 05/08/2011   HGB 11.4* 05/08/2011   HCT 34.2* 05/08/2011   MCV 96.1 05/08/2011   PLT 208 05/08/2011      Component Value Date/Time   NA 136 05/08/2011 0618   K 4.2 05/08/2011 0618   CL 102 05/08/2011 0618   CO2 25 05/08/2011 0618   GLUCOSE 111* 05/08/2011 0618   BUN 14 05/08/2011 0618   CREATININE 0.85 05/08/2011 0618   CALCIUM 9.4 05/08/2011 0618   GFRNONAA 70* 05/08/2011 0618   GFRAA 81* 05/08/2011 0618   No results found for this basename: CHOL,  HDL,  LDLCALC,  LDLDIRECT,  TRIG,  CHOLHDL   No results found for this basename: HGBA1C   No results found for this basename: VITAMINB12   No results found for this basename: TSH   10/29/12 CT head - normal; incidental cavum septum pellucidum  ASSESSMENT AND PLAN  67 y.o.  year old female  has a past medical history of Palpitations; Atrial fibrillation; Mitral valve prolapse; Chronic back pain; and Sciatica. here with recent new onset of severe headache.  CT Head at time of ER visit on 10/29/12 was negative.    PLAN: 1. MRI brain, MRA head at [facility]; to rule out secondary causes of HA 2. If scans are unremarkable, then diagnosis may be cervicogenic headaches 3. No headache meds for now, as symptoms are greatly improved  Orders Placed This Encounter  Procedures  . MR Brain W Wo Contrast  . MR MRA HEAD WO CONTRAST    Suanne Marker, MD (  with LYNN LAM NP-C) 11/15/2012, 12:33 PM Certified in Neurology, Neurophysiology and Neuroimaging  [facility] [address] [place], [place] [address] [phone]

## 2012-11-15 NOTE — Patient Instructions (Signed)
Will order MRI and MRA of head at [facility] for [facility].  Please visit [facility] to see if you think you can tolerate the test.  If you need sedative for scan, please call office to prescribe.

## 2012-12-28 ENCOUNTER — Other Ambulatory Visit: Payer: Self-pay | Admitting: Family Medicine

## 2012-12-28 ENCOUNTER — Ambulatory Visit
Admission: RE | Admit: 2012-12-28 | Discharge: 2012-12-28 | Disposition: A | Discharge status: Home / Self Care | Payer: 59 | Source: Ambulatory Visit | Attending: Family Medicine | Admitting: Family Medicine

## 2012-12-28 DIAGNOSIS — M79602 Pain in left arm: Secondary | ICD-10-CM

## 2013-04-26 ENCOUNTER — Ambulatory Visit: Payer: 59 | Admitting: Nurse Practitioner

## 2013-04-28 ENCOUNTER — Ambulatory Visit (INDEPENDENT_AMBULATORY_CARE_PROVIDER_SITE_OTHER): Payer: 59 | Admitting: Internal Medicine

## 2013-04-28 ENCOUNTER — Encounter: Payer: Self-pay | Admitting: Internal Medicine

## 2013-04-28 VITALS — BP 151/75 | HR 70 | Ht 67.0 in | Wt 198.8 lb

## 2013-04-28 DIAGNOSIS — R079 Chest pain, unspecified: Secondary | ICD-10-CM | POA: Insufficient documentation

## 2013-04-28 DIAGNOSIS — I4892 Unspecified atrial flutter: Secondary | ICD-10-CM

## 2013-04-28 NOTE — Assessment & Plan Note (Signed)
The etiology of her chest pain is unclear. I do not suspect that it is likely she has coronary disease. Her symptoms are not clearly related to exertion. She has a fairly normal EKG and can walk and for this reason we will plan to schedule an exercise treadmill test to screen her.

## 2013-04-28 NOTE — Progress Notes (Signed)
HPI Mrs. Orengo returns today for followup. She is a very pleasant 67 year old woman with a history of atrial flutter status post catheter ablation.  The patient has no significant tachy- palpitations. She denies shortness of breath or peripheral edema. She also has a history of arthritis and has been followed by an orthopedic surgeon. Her main complaint today is discomfort in her shoulder jaw and chest. There is no clear relation to exertion. There is no associated shortness of breath, nausea, vomiting, or diaphoresis. She has not had syncope. Allergies  Allergen Reactions  . Celebrex [Celecoxib] Other (See Comments)    hallucinations  . Darvocet [Propoxyphene-Acetaminophen] Other (See Comments)    hallucinations  . Flexeril [Cyclobenzaprine Hcl] Other (See Comments)    hallucinations  . Pregabalin Other (See Comments)    hallucinations     Current Outpatient Prescriptions  Medication Sig Dispense Refill  . BIOTIN PO Take 1 tablet by mouth daily.      . calcium acetate (PHOSLO) 667 MG capsule Take 667 mg by mouth 3 (three) times daily with meals.      . cholecalciferol (VITAMIN D) 1000 UNITS tablet Take 1,000 Units by mouth daily.        . fish oil-omega-3 fatty acids 1000 MG capsule Take 2 g by mouth daily.        . fluticasone (FLONASE) 50 MCG/ACT nasal spray 2 sprays daily.      Marland Kitchen glucosamine-chondroitin 500-400 MG tablet Take 1 tablet by mouth daily.        Marland Kitchen lidocaine (LIDODERM) 5 % Place 2 patches onto the skin daily. Remove & Discard patch within 12 hours or as directed by MD      . magnesium 30 MG tablet Take 30 mg by mouth daily.        . meclizine (ANTIVERT) 50 MG tablet Take 50 mg by mouth 3 (three) times daily as needed.      . meloxicam (MOBIC) 15 MG tablet       . Multiple Vitamin (MULTIVITAMIN WITH MINERALS) TABS Take 1 tablet by mouth daily.      . pantoprazole (PROTONIX) 40 MG tablet Take 1 tablet by mouth daily.      . predniSONE (STERAPRED UNI-PAK) 5 MG TABS Take 5-60  mg by mouth daily. Tapered dose       No current facility-administered medications for this visit.     Past Medical History  Diagnosis Date  . Palpitations   . Atrial fibrillation   . Mitral valve prolapse   . Chronic back pain   . Sciatica     ROS:   All systems reviewed and negative except as noted in the HPI.   Past Surgical History  Procedure Laterality Date  . Cardiac surgery       No family history on file.   History   Social History  . Marital Status: Married    Spouse Name: Tomasa Rand Sr.     Number of Children: 2  . Years of Education: BS   Occupational History  . Retired    Social History Main Topics  . Smoking status: Never Smoker   . Smokeless tobacco: Never Used  . Alcohol Use: No  . Drug Use: No  . Sexual Activity: Not on file   Other Topics Concern  . Not on file   Social History Narrative   [name]   Caffeine Use: 1 cup of coffee monthly.      BP  151/75  Pulse 70  Ht 5\' 7"  (1.702 m)  Wt 198 lb 12.8 oz (90.175 kg)  BMI 31.13 kg/m2  Physical Exam:  Well appearing 67 year old woman, NAD HEENT: Unremarkable Neck:  7 cm JVD, no thyromegally Back:  No CVA tenderness Lungs:  Clear with no wheezes, rales, or rhonchi. HEART:  Regular rate rhythm, no murmurs, no rubs, no clicks Abd:  soft, positive bowel sounds, no organomegally, no rebound, no guarding Ext:  2 plus pulses, no edema, no cyanosis, no clubbing Skin:  No rashes no nodules Neuro:  CN II through XII intact, motor grossly intact  EKG Normal sinus rhythm  Assess/Plan:

## 2013-04-28 NOTE — Patient Instructions (Signed)
Your physician has requested that you have an exercise tolerance test. For further information please visit [email or web address]. Please also follow instruction sheet, as given.  Your physician recommends that you continue on your current medications as directed. Please refer to the Current Medication list given to you today.  

## 2013-04-28 NOTE — Assessment & Plan Note (Signed)
She is status post ablation and has had no recurrent arrhythmias. She will undergo watchful waiting.

## 2013-05-11 ENCOUNTER — Encounter: Payer: 59 | Admitting: Internal Medicine

## 2013-05-23 ENCOUNTER — Ambulatory Visit (INDEPENDENT_AMBULATORY_CARE_PROVIDER_SITE_OTHER): Payer: Medicare Other | Admitting: Internal Medicine

## 2013-05-23 DIAGNOSIS — R079 Chest pain, unspecified: Secondary | ICD-10-CM

## 2013-05-23 NOTE — Progress Notes (Signed)
Exercise Treadmill Test  Pre-Exercise Testing Evaluation Rhythm: normal sinus  Rate: 62     Test  Exercise Tolerance Test Ordering MD: Lewayne Bunting, MD  Interpreting MD: Lewayne Bunting, MD  Unique Test No: 1  Treadmill:  1  Indication for ETT: chest pain - rule out ischemia  Contraindication to ETT: No   Stress Modality: exercise - treadmill  Cardiac Imaging Performed: non   Protocol: standard Bruce - maximal  Max BP:  159/78  Max MPHR (bpm):  152 85% MPR (bpm):  129  MPHR obtained (bpm):  153 % MPHR obtained:  1005  Reached 85% MPHR (min:sec):  4:23 Total Exercise Time (min-sec):  7:00  Workload in METS:  8.5 Borg Scale: 19  Reason ETT Terminated:  fatigue    ST Segment Analysis At Rest: normal ST segments - no evidence of significant ST depression With Exercise: no evidence of significant ST depression  Other Information Arrhythmia:  No Angina during ETT:  absent (0) Quality of ETT:  diagnostic  ETT Interpretation:  normal - no evidence of ischemia by ST analysis  Comments: Clinically and electrically negative  Recommendations: Regular daily exercise

## 2013-06-06 ENCOUNTER — Other Ambulatory Visit: Payer: Self-pay | Admitting: Family Medicine

## 2013-06-06 DIAGNOSIS — R1013 Epigastric pain: Secondary | ICD-10-CM

## 2013-06-06 DIAGNOSIS — R110 Nausea: Secondary | ICD-10-CM

## 2013-06-07 ENCOUNTER — Ambulatory Visit
Admission: RE | Admit: 2013-06-07 | Discharge: 2013-06-07 | Disposition: A | Discharge status: Home / Self Care | Payer: 59 | Source: Ambulatory Visit | Attending: Family Medicine | Admitting: Family Medicine

## 2013-06-07 DIAGNOSIS — R1013 Epigastric pain: Secondary | ICD-10-CM

## 2013-06-07 DIAGNOSIS — R110 Nausea: Secondary | ICD-10-CM

## 2013-10-19 ENCOUNTER — Ambulatory Visit (INDEPENDENT_AMBULATORY_CARE_PROVIDER_SITE_OTHER): Payer: Medicare Other | Admitting: Podiatry

## 2013-10-19 ENCOUNTER — Encounter: Payer: Self-pay | Admitting: Podiatry

## 2013-10-19 VITALS — BP 149/70 | HR 66 | Resp 12

## 2013-10-19 DIAGNOSIS — Z840 Family history of diseases of the skin and subcutaneous tissue: Secondary | ICD-10-CM

## 2013-10-19 DIAGNOSIS — Z831 Family history of other infectious and parasitic diseases: Secondary | ICD-10-CM

## 2013-10-19 DIAGNOSIS — M2040 Other hammer toe(s) (acquired), unspecified foot: Secondary | ICD-10-CM

## 2013-10-19 DIAGNOSIS — B351 Tinea unguium: Secondary | ICD-10-CM

## 2013-10-19 DIAGNOSIS — M79609 Pain in unspecified limb: Secondary | ICD-10-CM

## 2013-10-19 MED ORDER — ECONAZOLE NITRATE 1 % EX CREA
TOPICAL_CREAM | Freq: Every day | CUTANEOUS | Status: DC
Start: 2013-10-19 — End: 2013-10-19

## 2013-10-19 MED ORDER — EFINACONAZOLE 10 % EX SOLN: 1.0000 [drp] | Freq: Every day | CUTANEOUS | Status: DC

## 2013-10-19 MED ORDER — ECONAZOLE NITRATE 1 % EX CREA: TOPICAL_CREAM | Freq: Every day | CUTANEOUS | Status: DC

## 2013-10-19 NOTE — Progress Notes (Signed)
   Subjective:    Patient ID: Nancy Kline, female    DOB: [birth year], 68 y.o.   MRN: [id number]  HPIPT STATED B/L GREAT TOENAIL ARE SORE FOR ABOUT 2 YEARS. THE TOENAILS ARE BEEN THE SAME BUT NOT GETTING WORSE. THE TOENAILS GET AGGRAVATED WEARING SHOES OR PRESSURE AND TRIED TO KEEP THEM TRIM. ALSO, B/L 3RD TOE HAVE HARD SKIN AND THEY HURT BOTTOM OF THE NAILS ESPECIALLY PUTTING  PRESSURE ON IT.    Review of Systems  Genitourinary: Positive for frequency.  Musculoskeletal: Positive for joint swelling.  All other systems reviewed and are negative.      Objective:   Physical Exam: I have reviewed her past history medications allergies surgeries social history and review of systems. Pulses are strongly palpable bilateral neurologic sensorium is intact. Degenerative flexor intact bilateral muscle strength is 5 over 5 dorsiflexors plantar flexors inverters everters all intrinsic musculature is intact. Orthopedic evaluation demonstrates hammertoe deformities third being the worst bilateral with osteoarthritic changes. Cutaneous evaluation demonstrates supple well hydrated cutis dorsum of the foot with mild tinea pedis plantar aspect of the foot with thick yellow dystrophic clinically mycotic nails particularly the hallux and third digits bilaterally. These do also appear to be dystrophic in nature. She also has a distal clavus to the third digit bilateral.        Assessment & Plan:  Assessment: Tinea pedis pain in limb secondary to onychomycosis bilateral. Hammertoe deformity bilateral.  Plan: Prescription for Spectazole cream. Debridement nails 1 through 5 bilateral. Followup with her on as-needed basis.

## 2013-10-25 ENCOUNTER — Telehealth: Payer: Self-pay | Admitting: *Deleted

## 2013-10-25 NOTE — Telephone Encounter (Signed)
Need verification of prescription.  Should the Econozole cream be applied once daily or twice daily.  I faxed back response of twice a day.

## 2013-10-31 ENCOUNTER — Telehealth: Payer: Self-pay | Admitting: *Deleted

## 2013-10-31 NOTE — Telephone Encounter (Signed)
[facility] sent over a  request for clarification on instructions for Econazole Cream 1%.  I informed them apply to feet twice daily per Dr. Al CorpusHyatt.

## 2014-01-04 ENCOUNTER — Other Ambulatory Visit: Payer: Self-pay | Admitting: Family Medicine

## 2014-01-04 ENCOUNTER — Ambulatory Visit
Admission: RE | Admit: 2014-01-04 | Discharge: 2014-01-04 | Disposition: A | Discharge status: Home / Self Care | Payer: 59 | Source: Ambulatory Visit | Attending: Family Medicine | Admitting: Family Medicine

## 2014-01-04 DIAGNOSIS — M25512 Pain in left shoulder: Secondary | ICD-10-CM

## 2014-08-01 ENCOUNTER — Ambulatory Visit
Admission: RE | Admit: 2014-08-01 | Discharge: 2014-08-01 | Disposition: A | Discharge status: Home / Self Care | Payer: PRIVATE HEALTH INSURANCE | Source: Ambulatory Visit | Attending: Family Medicine | Admitting: Family Medicine

## 2014-08-01 ENCOUNTER — Other Ambulatory Visit: Payer: Self-pay | Admitting: Family Medicine

## 2014-08-01 DIAGNOSIS — M79671 Pain in right foot: Secondary | ICD-10-CM

## 2014-09-25 ENCOUNTER — Ambulatory Visit (INDEPENDENT_AMBULATORY_CARE_PROVIDER_SITE_OTHER): Payer: Medicare Other | Admitting: Podiatry

## 2014-09-25 ENCOUNTER — Encounter: Payer: Self-pay | Admitting: Podiatry

## 2014-09-25 VITALS — BP 95/55 | HR 59 | Resp 16

## 2014-09-25 DIAGNOSIS — L603 Nail dystrophy: Secondary | ICD-10-CM | POA: Diagnosis not present

## 2014-09-25 DIAGNOSIS — L600 Ingrowing nail: Secondary | ICD-10-CM | POA: Diagnosis not present

## 2014-09-25 NOTE — Progress Notes (Signed)
She presents today complaining of painful hallux right. She is also concerned discoloration in the central aspect of the toenail. She states that the corners appear to be ingrowing and are exquisitely painful with shoe gear. She is also complaining of right heel pain.  Objective: Vital signs are stable she is alert and oriented 3. Pulses are palpable bilateral. No Pain. Sharp incurvated nail margins along the tibia and fibula border of the hallux right with a central area of discoloration in the central aspect of the nail plate which is sample was taken today. She also has mild tenderness on palpation medial calcaneal tubercles of the right heel.  Assessment: Ingrown nail paronychia abscess hallux right. History of plantar fasciitis right. Nail dystrophy K rule out onychomycosis hallux right.  Plan: Chemical matrixectomy was performed after local anesthesia was achieved right foot tibial and fibular border. Samples of the nail were taken today for pathologic evaluation. I also dispensed silicone heel pads for her plantar fasciitis. I will follow with her in 1 week.

## 2014-09-25 NOTE — Patient Instructions (Signed)

## 2014-10-02 ENCOUNTER — Ambulatory Visit (INDEPENDENT_AMBULATORY_CARE_PROVIDER_SITE_OTHER): Payer: Medicare Other | Admitting: Podiatry

## 2014-10-02 ENCOUNTER — Encounter: Payer: Self-pay | Admitting: Podiatry

## 2014-10-02 DIAGNOSIS — L600 Ingrowing nail: Secondary | ICD-10-CM

## 2014-10-02 NOTE — Progress Notes (Signed)
She presents today for follow-up of matrixectomy hallux right. She states this seems to be doing okay and that she has been soaking twice daily and Betadine and water.  Objective: Her signs are stable she is alert and oriented 3. There is no erythema saline as drainage or odor.  Assessment: Healing matrixectomy right.  Plan: Discontinue Betadine start with Epsom salts and warm water soaks. Continue to apply Cortisporin otic. Covered during the daytime with a Band-Aid and leave open at night time. She will continue soaking to completely well notify us of any changes.

## 2014-10-18 ENCOUNTER — Encounter: Payer: Self-pay | Admitting: Podiatry

## 2014-10-29 ENCOUNTER — Ambulatory Visit (INDEPENDENT_AMBULATORY_CARE_PROVIDER_SITE_OTHER): Payer: Medicare Other | Admitting: Podiatry

## 2014-10-29 ENCOUNTER — Encounter: Payer: Self-pay | Admitting: Podiatry

## 2014-10-29 VITALS — BP 115/61 | HR 83 | Resp 16 | Ht 67.0 in | Wt 196.0 lb

## 2014-10-29 DIAGNOSIS — B351 Tinea unguium: Secondary | ICD-10-CM

## 2014-10-29 MED ORDER — TERBINAFINE HCL 250 MG PO TABS: 250.0000 mg | ORAL_TABLET | Freq: Every day | ORAL | Status: DC

## 2014-10-29 NOTE — Progress Notes (Signed)
She presents today for follow-up of her matrixectomy hallux right she states it seems to be doing really well and is only mildly tender. She also presents today for follow-up of her pathology report.  Objective: Vital signs are stable she is alert and oriented 3. Pulses are palpable bilateral. Neurologic sensorium is intact per Semmes-Weinstein monofilament. Margins to the hallux right appear to be healing very well there is no erythema and a saline as drainage or odor. Pathology report was positive for onychomycosis.  Assessment: Well-healing surgical toe hallux right. Onychomycosis bilateral foot.  Plan: After discussing the pros and cons of oral therapy versus topical therapy and laser therapy currently we have chosen oral therapy. We will start her on Lamisil 250 mg tablets 1 by mouth daily #30. She will also have liver profile drawn today and I will follow-up with her should this come back abnormal. Otherwise I will follow up with her in 1 month

## 2014-10-30 ENCOUNTER — Telehealth: Payer: Self-pay | Admitting: *Deleted

## 2014-10-30 LAB — HEPATIC FUNCTION PANEL
ALT: 8 IU/L (ref 0–32)
AST: 14 IU/L (ref 0–40)
Albumin: 4.3 g/dL (ref 3.5–4.8)
Alkaline Phosphatase: 57 IU/L (ref 39–117)
BILIRUBIN TOTAL: 0.4 mg/dL (ref 0.0–1.2)
Bilirubin, Direct: 0.08 mg/dL (ref 0.00–0.40)
Total Protein: 6.9 g/dL (ref 6.0–8.5)

## 2014-10-30 LAB — CBC WITH DIFFERENTIAL/PLATELET
BASOS ABS: 0 10*3/uL (ref 0.0–0.2)
Basos: 0 %
EOS ABS: 0.2 10*3/uL (ref 0.0–0.4)
Eos: 4 %
HEMATOCRIT: 34 % (ref 34.0–46.6)
Hemoglobin: 11.1 g/dL (ref 11.1–15.9)
Immature Grans (Abs): 0 10*3/uL (ref 0.0–0.1)
Immature Granulocytes: 0 %
LYMPHS ABS: 1.6 10*3/uL (ref 0.7–3.1)
LYMPHS: 31 %
MCH: 31.7 pg (ref 26.6–33.0)
MCHC: 32.6 g/dL (ref 31.5–35.7)
MCV: 97 fL (ref 79–97)
MONOS ABS: 0.4 10*3/uL (ref 0.1–0.9)
Monocytes: 8 %
NEUTROS ABS: 3 10*3/uL (ref 1.4–7.0)
Neutrophils Relative %: 57 %
Platelets: 231 10*3/uL (ref 150–379)
RBC: 3.5 x10E6/uL — ABNORMAL LOW (ref 3.77–5.28)
RDW: 13.7 % (ref 12.3–15.4)
WBC: 5.2 10*3/uL (ref 3.4–10.8)

## 2014-10-30 NOTE — Telephone Encounter (Signed)
Left message for Alexcis regarding blood work. Ok to continue with medication ,

## 2014-10-30 NOTE — Telephone Encounter (Signed)
-----   Message from Elinor ParkinsonMax T Hyatt, [place] sent at 10/30/2014  7:24 AM EDT ----- Blood work looks good and may continue medication.

## 2014-11-26 ENCOUNTER — Ambulatory Visit (INDEPENDENT_AMBULATORY_CARE_PROVIDER_SITE_OTHER): Payer: Medicare Other | Admitting: Podiatry

## 2014-11-26 VITALS — BP 135/68 | HR 72 | Resp 16

## 2014-11-26 DIAGNOSIS — Z79899 Other long term (current) drug therapy: Secondary | ICD-10-CM

## 2014-11-26 MED ORDER — TERBINAFINE HCL 250 MG PO TABS: 250.0000 mg | ORAL_TABLET | Freq: Every day | ORAL | Status: DC

## 2014-11-26 NOTE — Progress Notes (Signed)
She presents today for follow-up of her onychomycosis. She's been taking terbinafine on a regular basis. Recently she's become concerned about the effects of the terbinafine because of some numbness in her toes as well as severe pain emanating from her cramps from her distal hallux right extending up to her groin and posterior thigh. She denies any past medical history changes. She denies fever chills nausea vomiting rashes and itching.  Objective: Vital signs are stable alert and oriented 3. No change in the nail plates that still yet.  Assessment: Onychomycosis.  Plan: I'm requesting that she continue her antifungal on a regular basis for the next 3 months we also sent her out for more blood work. Should this come back abnormal I will notify her immediately.

## 2015-04-01 ENCOUNTER — Encounter: Payer: Self-pay | Admitting: Podiatry

## 2015-04-01 ENCOUNTER — Ambulatory Visit (INDEPENDENT_AMBULATORY_CARE_PROVIDER_SITE_OTHER): Payer: Medicare Other | Admitting: Podiatry

## 2015-04-01 DIAGNOSIS — B351 Tinea unguium: Secondary | ICD-10-CM | POA: Diagnosis not present

## 2015-04-01 DIAGNOSIS — G629 Polyneuropathy, unspecified: Secondary | ICD-10-CM

## 2015-04-01 NOTE — Progress Notes (Signed)
She presents today for her follow-up long-term therapy with Lamisil for onychomycosis. She states that she forgets to take medicine every day and still has several left in the container. She states that the hallux right foot appears to be growing out.  Objective: Vital signs are stable she is alert and oriented 3. Nail plate has grown out a proximal 75%. Pulses are palpable.  Assessment: Long-term therapy with Lamisil.  Plan: She will finish up her Lamisil and follow-up with me in 2-3 months for reevaluation. At that point we may need to represcribed Lamisil.

## 2015-04-30 ENCOUNTER — Other Ambulatory Visit: Payer: Self-pay | Admitting: Family Medicine

## 2015-04-30 DIAGNOSIS — M5416 Radiculopathy, lumbar region: Secondary | ICD-10-CM

## 2015-05-19 ENCOUNTER — Other Ambulatory Visit: Payer: PRIVATE HEALTH INSURANCE

## 2015-05-22 ENCOUNTER — Ambulatory Visit
Admission: RE | Admit: 2015-05-22 | Discharge: 2015-05-22 | Disposition: A | Discharge status: Home / Self Care | Payer: PRIVATE HEALTH INSURANCE | Source: Ambulatory Visit | Attending: Family Medicine | Admitting: Family Medicine

## 2015-05-22 DIAGNOSIS — M5416 Radiculopathy, lumbar region: Secondary | ICD-10-CM

## 2015-06-24 ENCOUNTER — Other Ambulatory Visit: Payer: Self-pay | Admitting: Family Medicine

## 2015-06-24 ENCOUNTER — Ambulatory Visit: Payer: Medicare Other | Admitting: Podiatry

## 2015-06-24 DIAGNOSIS — M79605 Pain in left leg: Principal | ICD-10-CM

## 2015-06-24 DIAGNOSIS — M79604 Pain in right leg: Secondary | ICD-10-CM

## 2015-07-01 ENCOUNTER — Encounter: Payer: Self-pay | Admitting: *Deleted

## 2015-07-02 ENCOUNTER — Ambulatory Visit (INDEPENDENT_AMBULATORY_CARE_PROVIDER_SITE_OTHER): Payer: Medicare Other | Admitting: Diagnostic Neuroimaging

## 2015-07-02 ENCOUNTER — Encounter: Payer: Self-pay | Admitting: Diagnostic Neuroimaging

## 2015-07-02 VITALS — BP 122/56 | HR 72 | Ht 67.0 in | Wt 191.2 lb

## 2015-07-02 DIAGNOSIS — R208 Other disturbances of skin sensation: Secondary | ICD-10-CM | POA: Diagnosis not present

## 2015-07-02 DIAGNOSIS — M79642 Pain in left hand: Secondary | ICD-10-CM

## 2015-07-02 DIAGNOSIS — R200 Anesthesia of skin: Secondary | ICD-10-CM

## 2015-07-02 DIAGNOSIS — G609 Hereditary and idiopathic neuropathy, unspecified: Secondary | ICD-10-CM | POA: Insufficient documentation

## 2015-07-02 DIAGNOSIS — G2581 Restless legs syndrome: Secondary | ICD-10-CM | POA: Diagnosis not present

## 2015-07-02 MED ORDER — GABAPENTIN 100 MG PO CAPS: 100.0000 mg | ORAL_CAPSULE | Freq: Every day | ORAL | Status: DC

## 2015-07-02 NOTE — Patient Instructions (Addendum)
Thank you for coming to see [place] at [facility]. I hope we have been able to provide you high quality care today.  You may receive a patient satisfaction survey over the next few weeks. We would appreciate your feedback and comments so that we may continue to improve ourselves and the health of our patients.  - I will check lab testing  - try gabapentin 11m at bedtime; every 1 week may increase by 103mat bedtime up to [id number] bedtime - try neuropathy cream - I will refer you to hand surgeon for carpal tunnel syndrome pain treatments   ~~~~~~~~~~~~~~~~~~~~~~~~~~~~~~~~~~~~~~~~~~~~~~~~~~~~~~~~~~~~~~~~~  DR. PENUMALLI'S GUIDE TO HAPPY AND HEALTHY LIVING These are some of my general health and wellness recommendations. Some of them may apply to you better than others. Please use common sense as you try these suggestions and feel free to ask me any questions.   ACTIVITY/FITNESS Mental, social, emotional and physical stimulation are very important for brain and body health. Try learning a new activity (arts, music, language, sports, games).  Keep moving your body to the best of your abilities. You can do this at home, inside or outside, the park, community center, gym or anywhere you like. Consider a physical therapist or personal trainer to get started. Consider the app Sworkit. Fitness trackers such as smart-watches, smart-phones or Fitbits can help as well.   NUTRITION Eat more plants: colorful vegetables, nuts, seeds and berries.  Eat less sugar, salt, preservatives and processed foods.  Avoid toxins such as cigarettes and alcohol.  Drink water when you are thirsty. Warm water with a slice of lemon is an excellent morning drink to start the day.  Consider these websites for more information The Nutrition Source (htthttps://www.henry-hernandez.biz/recision Nutrition (wwwWindowBlog.ch RELAXATION Consider practicing  mindfulness meditation or other relaxation techniques such as deep breathing, prayer, yoga, tai chi, massage. See website mindful.org or the apps Headspace or Calm to help get started.   SLEEP Try to get at least 7-8+ hours sleep per day. Regular exercise and reduced caffeine will help you sleep better. Practice good sleep hygeine techniques. See website sleep.org for more information.   PLANNING Prepare estate planning, living will, healthcare POA documents. Sometimes this is best planned with the help of an attorney. Theconversationproject.org and agingwithdignity.org are excellent resources.

## 2015-07-02 NOTE — Progress Notes (Signed)
[facility]  PATIENT: Nancy Kline DOB: 1946/01/06  REFERRING CLINICIAN: Harris HISTORY FROM: patient  REASON FOR VISIT: new consult    HISTORICAL  CHIEF COMPLAINT:  Chief Complaint  Patient presents with  . Pain    rm 6, New patietn, bilateral leg pain/weakness    HISTORY OF PRESENT ILLNESS:   69 year old left-handed female here for evaluation of lower extremity pain. For past 2 months patient has had intermittent pain, cramps, numbness and tingling in the feet, legs, calves, upper legs and low back. Symptoms are worse in the evening especially when she lays down or sits down. Symptoms are slightly improved she is up and moving around. Cold weather seems to aggravate her symptoms. Patient had MRI lumbar spine which was unremarkable. Patient referred to me for further evaluation.  Patient also complaining of long-standing numbness, tingling and pain in her fingers and hands since the 1980s. She thinks she has had carpal tunnel diagnosis from the past, and has been using response intermittently. She does not want to have nerve conduction studies at this time.   REVIEW OF SYSTEMS: Full 14 system review of systems performed and notable only for joint pain joint swelling cramps aching muscles incontinence ringing in ears numbness weakness restless legs.  ALLERGIES: Allergies  Allergen Reactions  . Celebrex [Celecoxib] Other (See Comments)    hallucinations  . Darvocet [Propoxyphene N-Acetaminophen] Other (See Comments)    hallucinations  . Flexeril [Cyclobenzaprine Hcl] Other (See Comments)    hallucinations  . Pregabalin Other (See Comments)    hallucinations    HOME MEDICATIONS: Outpatient Prescriptions Prior to Visit  Medication Sig Dispense Refill  . BIOTIN PO Take 1 tablet by mouth daily.    . calcium acetate (PHOSLO) 667 MG capsule Take 667 mg by mouth 3 (three) times daily with meals.    . cholecalciferol (VITAMIN D) 1000 UNITS tablet Take  1,000 Units by mouth daily.      . Efinaconazole 10 % SOLN Apply 1 drop topically daily. 4 mL 11  . fish oil-omega-3 fatty acids 1000 MG capsule Take 2 g by mouth daily.      . fluticasone (FLONASE) 50 MCG/ACT nasal spray 2 sprays daily.    Marland Kitchen glucosamine-chondroitin 500-400 MG tablet Take 1 tablet by mouth daily.      . magnesium 30 MG tablet Take 30 mg by mouth daily.      . meclizine (ANTIVERT) 50 MG tablet Take 50 mg by mouth 3 (three) times daily as needed.    . Multiple Vitamin (MULTIVITAMIN WITH MINERALS) TABS Take 1 tablet by mouth daily.    . pantoprazole (PROTONIX) 40 MG tablet Take 1 tablet by mouth daily.    Marland Kitchen terbinafine (LAMISIL) 250 MG tablet Take 1 tablet (250 mg total) by mouth daily. 90 tablet 0  . econazole nitrate 1 % cream Apply topically daily. Apply to feet twice daily 85 g 11  . lidocaine (LIDODERM) 5 % Place 2 patches onto the skin daily. Remove & Discard patch within 12 hours or as directed by MD     No facility-administered medications prior to visit.    PAST MEDICAL HISTORY: Past Medical History  Diagnosis Date  . Palpitations   . Atrial fibrillation (HCC)   . Mitral valve prolapse   . Chronic back pain   . Sciatica   . Hyperlipidemia   . GERD (gastroesophageal reflux disease)   . Carpal tunnel syndrome   . Overactive bladder   . Vertigo  PAST SURGICAL HISTORY: Past Surgical History  Procedure Laterality Date  . Cardiac surgery      ablation    FAMILY HISTORY: No family history on file.  SOCIAL HISTORY:  Social History   Social History  . Marital Status: Married    Spouse Name: Tomasa RandGleenreus Stoke Sr.   . Number of Children: 2  . Years of Education: BS   Occupational History  . Retired    Social History Main Topics  . Smoking status: Never Smoker   . Smokeless tobacco: Never Used  . Alcohol Use: No  . Drug Use: No  . Sexual Activity: Not on file   Other Topics Concern  . Not on file   Social History Narrative   Pt lives at home  with her spouse.   Caffeine Use: 1 cup of coffee monthly.      PHYSICAL EXAM  GENERAL EXAM/CONSTITUTIONAL: Vitals:  Filed Vitals:   07/02/15 0956  BP: 122/56  Pulse: 72  Height: 5\' 7"  (1.702 m)  Weight: 191 lb 3.2 oz (86.728 kg)     Body mass index is 29.94 kg/(m^2).  Visual Acuity Screening   Right eye Left eye Both eyes  Without correction:     With correction: 20/30 20/40      Patient is in no distress; well developed, nourished and groomed; neck is supple  CARDIOVASCULAR:  Examination of carotid arteries is normal; no carotid bruits  Regular rate and rhythm, no murmurs  Examination of peripheral vascular system by observation and palpation is normal  EYES:  Ophthalmoscopic exam of optic discs and posterior segments is normal; no papilledema or hemorrhages  MUSCULOSKELETAL:  Gait, strength, tone, movements noted in Neurologic exam below  NEUROLOGIC: MENTAL STATUS:  No flowsheet data found.  awake, alert, oriented to person, place and time  recent and remote memory intact  normal attention and concentration  language fluent, comprehension intact, naming intact,   fund of knowledge appropriate  CRANIAL NERVE:   2nd - no papilledema on fundoscopic exam  2nd, 3rd, 4th, 6th - pupils equal and reactive to light, visual fields full to confrontation, extraocular muscles intact, no nystagmus  5th - facial sensation symmetric  7th - facial strength symmetric  8th - hearing intact  9th - palate elevates symmetrically, uvula midline  11th - shoulder shrug symmetric  12th - tongue protrusion midline  MOTOR:   normal bulk and tone, full strength in the BUE, BLE  SENSORY:   normal and symmetric to light touch, temperature, vibration; SLIGHTLY DECR PP IN LEFT HAND DIGIT 2  COORDINATION:   finger-nose-finger, fine finger movements normal  REFLEXES:   deep tendon reflexes present and symmetric  GAIT/STATION:   narrow based gait; able to  walk tandem; romberg is negative    DIAGNOSTIC DATA (LABS, IMAGING, TESTING) - I reviewed patient records, labs, notes, testing and imaging myself where available.  Lab Results  Component Value Date   WBC 5.2 10/29/2014   HGB 11.1 10/29/2014   HCT 34.0 10/29/2014   MCV 97 10/29/2014   PLT 231 10/29/2014      Component Value Date/Time   NA 136 05/08/2011 0618   K 4.2 05/08/2011 0618   CL 102 05/08/2011 0618   CO2 25 05/08/2011 0618   GLUCOSE 111* 05/08/2011 0618   BUN 14 05/08/2011 0618   CREATININE 0.85 05/08/2011 0618   CALCIUM 9.4 05/08/2011 0618   PROT 6.9 10/29/2014 1545   ALBUMIN 4.3 10/29/2014 1545   AST 14 10/29/2014  1545   ALT 8 10/29/2014 1545   ALKPHOS 57 10/29/2014 1545   BILITOT 0.4 10/29/2014 1545   GFRNONAA 70* 05/08/2011 0618   GFRAA 81* 05/08/2011 0618   No results found for: CHOL, HDL, LDLCALC, LDLDIRECT, TRIG, CHOLHDL No results found for: ZOXW9U No results found for: VITAMINB12 No results found for: TSH   05/22/15 MRI lumbar spine [I reviewed images myself and agree with interpretation. -VRP]  1. At L2-3 there is a mild broad-based disc bulge. 2. At L3-4 there is a mild broad-based disc bulge flattening the ventral thecal sac. 3. At L4-5 there is a moderate broad-based disc bulge flattening the ventral thecal sac and bilateral foraminal narrowing. 4. At L5-S1 there is mild broad-based disc bulge with mild bilateral facet arthropathy.     ASSESSMENT AND PLAN  69 y.o. year old female here with pain and cramps in legs   Ddx: RLS, neuropathy, myopathy  RLS (restless legs syndrome)    PLAN: - trial of neuropathy cream and gabapentin - check neuropathy and myopathy labs - refer to hand surgery for hand numbness / pain (possible carpal tunnel syndrome; patient declines nerve conduction studies)  Orders Placed This Encounter  Procedures  . Vitamin B12  . TSH  . Hemoglobin A1c  . Ferritin  . Iron and TIBC  . CK  . Aldolase   Meds  ordered this encounter  Medications  . gabapentin (NEURONTIN) 100 MG capsule    Sig: Take 1-3 capsules (100-300 mg total) by mouth at bedtime.    Dispense:  90 capsule    Refill:  6   Return in about 2 months (around 09/02/2015).    Suanne Marker, MD 07/02/2015, 10:46 AM Certified in Neurology, Neurophysiology and Neuroimaging  [facility] [address], [address] [place], [place] [address] [phone]

## 2015-07-03 ENCOUNTER — Ambulatory Visit
Admission: RE | Admit: 2015-07-03 | Discharge: 2015-07-03 | Disposition: A | Discharge status: Home / Self Care | Payer: Medicare Other | Source: Ambulatory Visit | Attending: Family Medicine | Admitting: Family Medicine

## 2015-07-03 DIAGNOSIS — M79605 Pain in left leg: Principal | ICD-10-CM

## 2015-07-03 DIAGNOSIS — M79604 Pain in right leg: Secondary | ICD-10-CM

## 2015-07-03 LAB — IRON AND TIBC
IRON: 112 ug/dL (ref 27–139)
Iron Saturation: 35 % (ref 15–55)
Total Iron Binding Capacity: 316 ug/dL (ref 250–450)
UIBC: 204 ug/dL (ref 118–369)

## 2015-07-03 LAB — FERRITIN: Ferritin: 40 ng/mL (ref 15–150)

## 2015-07-03 LAB — VITAMIN B12

## 2015-07-03 LAB — ALDOLASE: ALDOLASE: 4.7 U/L (ref 3.3–10.3)

## 2015-07-03 LAB — CK: CK TOTAL: 144 U/L (ref 24–173)

## 2015-07-03 LAB — TSH: TSH: 2.63 u[IU]/mL (ref 0.450–4.500)

## 2015-07-03 LAB — HEMOGLOBIN A1C
ESTIMATED AVERAGE GLUCOSE: 123 mg/dL
HEMOGLOBIN A1C: 5.9 % — AB (ref 4.8–5.6)

## 2015-07-10 ENCOUNTER — Telehealth: Payer: Self-pay | Admitting: *Deleted

## 2015-07-10 NOTE — Telephone Encounter (Signed)
Pt called to get results.she did not understand message

## 2015-07-10 NOTE — Telephone Encounter (Signed)
LVM informing patient recent labs are unremarkable, Hgb A1C slightly elevated. Left this caller's name, number for questions.

## 2015-07-10 NOTE — Telephone Encounter (Signed)
Spoke with patient and clarified previous message to her satisfaction. She stated she has received compounded cream and used x 3 with good results. She verbalized appreciation for call.

## 2015-07-13 ENCOUNTER — Other Ambulatory Visit: Payer: Self-pay | Admitting: Podiatry

## 2015-08-20 ENCOUNTER — Other Ambulatory Visit: Payer: Self-pay | Admitting: Family Medicine

## 2015-08-20 DIAGNOSIS — M25562 Pain in left knee: Secondary | ICD-10-CM

## 2015-08-29 ENCOUNTER — Ambulatory Visit
Admission: RE | Admit: 2015-08-29 | Discharge: 2015-08-29 | Disposition: A | Discharge status: Home / Self Care | Payer: Medicare Other | Source: Ambulatory Visit | Attending: Family Medicine | Admitting: Family Medicine

## 2015-08-29 DIAGNOSIS — M25562 Pain in left knee: Secondary | ICD-10-CM

## 2015-09-03 ENCOUNTER — Ambulatory Visit (INDEPENDENT_AMBULATORY_CARE_PROVIDER_SITE_OTHER): Payer: Medicare Other | Admitting: Diagnostic Neuroimaging

## 2015-09-03 ENCOUNTER — Encounter: Payer: Self-pay | Admitting: Diagnostic Neuroimaging

## 2015-09-03 VITALS — BP 116/48 | HR 66 | Ht 67.0 in | Wt 191.0 lb

## 2015-09-03 DIAGNOSIS — G609 Hereditary and idiopathic neuropathy, unspecified: Secondary | ICD-10-CM

## 2015-09-03 DIAGNOSIS — G2581 Restless legs syndrome: Secondary | ICD-10-CM

## 2015-09-03 NOTE — Progress Notes (Signed)
[facility]  PATIENT: Nancy Kline DOB: Oct 21, 1945  REFERRING CLINICIAN: Harris HISTORY FROM: patient  REASON FOR VISIT: follow up   HISTORICAL  CHIEF COMPLAINT:  Chief Complaint  Patient presents with  . RLS    rm 6, "Gabapentin, Naproxen made symptoms worse; Motrin as needed"  . Follow-up    2 month    HISTORY OF PRESENT ILLNESS:   UPDATE 09/03/15: Since last visit, tried gabapentin and naproxen (1 month) then developed hair loss. Then stopped gabapentin and naproxen. Now on OTC motrin, which is helping more. Neuropathy cream helping a little. Now with left knee pain.   PRIOR HPI (07/02/15): 70 year old left-handed female here for evaluation of lower extremity pain. For past 2 months patient has had intermittent pain, cramps, numbness and tingling in the feet, legs, calves, upper legs and low back. Symptoms are worse in the evening especially when she lays down or sits down. Symptoms are slightly improved she is up and moving around. Cold weather seems to aggravate her symptoms. Patient had MRI lumbar spine which was unremarkable. Patient referred to me for further evaluation. Patient also complaining of long-standing numbness, tingling and pain in her fingers and hands since the 1980s. She thinks she has had carpal tunnel diagnosis from the past, and has been using response intermittently. She does not want to have nerve conduction studies at this time.   REVIEW OF SYSTEMS: Full 14 system review of systems performed and notable only for joint pain back pain aching muscles restless legs.    ALLERGIES: Allergies  Allergen Reactions  . Celebrex [Celecoxib] Other (See Comments)    hallucinations  . Darvocet [Propoxyphene N-Acetaminophen] Other (See Comments)    hallucinations  . Flexeril [Cyclobenzaprine Hcl] Other (See Comments)    hallucinations  . Pregabalin Other (See Comments)    hallucinations    HOME MEDICATIONS: Outpatient Prescriptions Prior  to Visit  Medication Sig Dispense Refill  . baclofen (LIORESAL) 10 MG tablet Take 10 mg by mouth daily. Pt unsure of dose; will call to inform    . BIOTIN PO Take 1 tablet by mouth daily.    . calcium acetate (PHOSLO) 667 MG capsule Take 667 mg by mouth 3 (three) times daily with meals.    . cholecalciferol (VITAMIN D) 1000 UNITS tablet Take 1,000 Units by mouth daily.      . Efinaconazole 10 % SOLN Apply 1 drop topically daily. 4 mL 11  . fish oil-omega-3 fatty acids 1000 MG capsule Take 2 g by mouth daily.      . fluticasone (FLONASE) 50 MCG/ACT nasal spray 2 sprays daily.    Marland Kitchen glucosamine-chondroitin 500-400 MG tablet Take 1 tablet by mouth daily.      . magnesium 30 MG tablet Take 30 mg by mouth daily.      . meclizine (ANTIVERT) 50 MG tablet Take 50 mg by mouth 3 (three) times daily as needed.    . Multiple Vitamin (MULTIVITAMIN WITH MINERALS) TABS Take 1 tablet by mouth daily.    . pantoprazole (PROTONIX) 40 MG tablet Take 1 tablet by mouth daily.    Marland Kitchen terbinafine (LAMISIL) 250 MG tablet Take 1 tablet (250 mg total) by mouth daily. 90 tablet 0  . gabapentin (NEURONTIN) 100 MG capsule Take 1-3 capsules (100-300 mg total) by mouth at bedtime. (Patient not taking: Reported on 09/03/2015) 90 capsule 6  . naproxen (NAPROSYN) 500 MG tablet 500 mg. Reported on 09/03/2015     No facility-administered medications prior to  visit.    PAST MEDICAL HISTORY: Past Medical History  Diagnosis Date  . Palpitations   . Atrial fibrillation (HCC)   . Mitral valve prolapse   . Chronic back pain   . Sciatica   . Hyperlipidemia   . GERD (gastroesophageal reflux disease)   . Carpal tunnel syndrome   . Overactive bladder   . Vertigo     PAST SURGICAL HISTORY: Past Surgical History  Procedure Laterality Date  . Cardiac surgery      ablation    FAMILY HISTORY: Family History  Problem Relation Age of Onset  . Hypertension Mother   . Dementia Mother     SOCIAL HISTORY:  Social History    Social History  . Marital Status: Married    Spouse Name: Aala Ransom Sr.   . Number of Children: 2  . Years of Education: BS   Occupational History  . Retired    Social History Main Topics  . Smoking status: Never Smoker   . Smokeless tobacco: Never Used  . Alcohol Use: No  . Drug Use: No  . Sexual Activity: Not on file   Other Topics Concern  . Not on file   Social History Narrative   [name]   Caffeine Use: 1 cup of coffee monthly.      PHYSICAL EXAM  GENERAL EXAM/CONSTITUTIONAL: Vitals:  Filed Vitals:   09/03/15 1331  BP: 116/48  Pulse: 66  Height: 5\' 7"  (1.702 m)  Weight: 191 lb (86.637 kg)   Body mass index is 29.91 kg/(m^2). No exam data present  Patient is in no distress; well developed, nourished and groomed; neck is supple  CARDIOVASCULAR:  Examination of carotid arteries is normal; no carotid bruits  Regular rate and rhythm, no murmurs  Examination of peripheral vascular system by observation and palpation is normal  EYES:  Ophthalmoscopic exam of optic discs and posterior segments is normal; no papilledema or hemorrhages  MUSCULOSKELETAL:  Gait, strength, tone, movements noted in Neurologic exam below  NEUROLOGIC: MENTAL STATUS:  No flowsheet data found.  awake, alert, oriented to person, place and time  recent and remote memory intact  normal attention and concentration  language fluent, comprehension intact, naming intact,   fund of knowledge appropriate  CRANIAL NERVE:    2nd, 3rd, 4th, 6th - pupils equal and reactive to light, visual fields full to confrontation, extraocular muscles intact, no nystagmus  5th - facial sensation symmetric  7th - facial strength symmetric  8th - hearing intact  9th - palate elevates symmetrically, uvula midline  11th - shoulder shrug symmetric  12th - tongue protrusion midline  MOTOR:   normal bulk and tone, full strength in the BUE, BLE  SENSORY:    normal and symmetric to light touch  COORDINATION:   finger-nose-finger, fine finger movements normal  REFLEXES:   deep tendon reflexes TRACE and symmetric; ABSENT AT ANKLES  GAIT/STATION:   narrow based gait; ANTALGIC GAIT; LIMPING ON LEFT KNEE     DIAGNOSTIC DATA (LABS, IMAGING, TESTING) - I reviewed patient records, labs, notes, testing and imaging myself where available.  Lab Results  Component Value Date   WBC 5.2 10/29/2014   HGB 11.1 10/29/2014   HCT 34.0 10/29/2014   MCV 97 10/29/2014   PLT 231 10/29/2014      Component Value Date/Time   NA 136 05/08/2011 0618   K 4.2 05/08/2011 0618   CL 102 05/08/2011 0618   CO2 25 05/08/2011  0618   GLUCOSE 111* 05/08/2011 0618   BUN 14 05/08/2011 0618   CREATININE 0.85 05/08/2011 0618   CALCIUM 9.4 05/08/2011 0618   PROT 6.9 10/29/2014 1545   ALBUMIN 4.3 10/29/2014 1545   AST 14 10/29/2014 1545   ALT 8 10/29/2014 1545   ALKPHOS 57 10/29/2014 1545   BILITOT 0.4 10/29/2014 1545   GFRNONAA 70* 05/08/2011 0618   GFRAA 81* 05/08/2011 0618   No results found for: CHOL, HDL, LDLCALC, LDLDIRECT, TRIG, CHOLHDL Lab Results  Component Value Date   HGBA1C 5.9* 07/02/2015   Lab Results  Component Value Date   VITAMINB12 >2000* 07/02/2015   Lab Results  Component Value Date   TSH 2.630 07/02/2015    05/22/15 MRI lumbar spine [I reviewed images myself and agree with interpretation. -VRP]  1. At L2-3 there is a mild broad-based disc bulge. 2. At L3-4 there is a mild broad-based disc bulge flattening the ventral thecal sac. 3. At L4-5 there is a moderate broad-based disc bulge flattening the ventral thecal sac and bilateral foraminal narrowing. 4. At L5-S1 there is mild broad-based disc bulge with mild bilateral facet arthropathy.  08/29/15 MRI left knee - Degenerative maceration of the posterior 1/2 of the body of the medial meniscus. 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  - Severe mucoid degeneration of the ACL.    ASSESSMENT AND PLAN  70 y.o. year old female here with pain and cramps in legs. 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 Slightly better on neuropathy cream and OTC motrin. Intolerant of gabapentin.  Ddx: RLS vs neuropathy  RLS (restless legs syndrome)  Hereditary and idiopathic peripheral neuropathy    PLAN: - continue neuropathy cream and motrin OTC - consider EMG/NCS; patient declines at this time - follow up with PCP and orthopedics re: left knee degenerative changes  Return if symptoms worsen or fail to improve, for return to PCP.    Suanne Marker, MD 09/03/2015, 1:59 PM Certified in Neurology, Neurophysiology and Neuroimaging  [facility] [address] [place], [place] [address] [phone]

## 2015-09-03 NOTE — Patient Instructions (Signed)
Thank you for coming to see [place] at [facility]. I hope we have been able to provide you high quality care today.  You may receive a patient satisfaction survey over the next few weeks. We would appreciate your feedback and comments so that we may continue to improve ourselves and the health of our patients.  - continue neuropathy cream   ~~~~~~~~~~~~~~~~~~~~~~~~~~~~~~~~~~~~~~~~~~~~~~~~~~~~~~~~~~~~~~~~~  DR. PENUMALLI'S GUIDE TO HAPPY AND HEALTHY LIVING These are some of my general health and wellness recommendations. Some of them may apply to you better than others. Please use common sense as you try these suggestions and feel free to ask me any questions.   ACTIVITY/FITNESS Mental, social, emotional and physical stimulation are very important for brain and body health. Try learning a new activity (arts, music, language, sports, games).  Keep moving your body to the best of your abilities. You can do this at home, inside or outside, the park, community center, gym or anywhere you like. Consider a physical therapist or personal trainer to get started. Consider the app Sworkit. Fitness trackers such as smart-watches, smart-phones or Fitbits can help as well.   NUTRITION Eat more plants: colorful vegetables, nuts, seeds and berries.  Eat less sugar, salt, preservatives and processed foods.  Avoid toxins such as cigarettes and alcohol.  Drink water when you are thirsty. Warm water with a slice of lemon is an excellent morning drink to start the day.  Consider these websites for more information The Nutrition Source (https://www.henry-hernandez.biz/) Precision Nutrition (WindowBlog.ch)   RELAXATION Consider practicing mindfulness meditation or other relaxation techniques such as deep breathing, prayer, yoga, tai chi, massage. See website mindful.org or the apps Headspace or Calm to help get started.   SLEEP Try to get at  least 7-8+ hours sleep per day. Regular exercise and reduced caffeine will help you sleep better. Practice good sleep hygeine techniques. See website sleep.org for more information.   PLANNING Prepare estate planning, living will, healthcare POA documents. Sometimes this is best planned with the help of an attorney. Theconversationproject.org and agingwithdignity.org are excellent resources.

## 2016-04-29 ENCOUNTER — Ambulatory Visit: Payer: Medicare Other | Admitting: Diagnostic Neuroimaging

## 2016-04-29 ENCOUNTER — Ambulatory Visit (INDEPENDENT_AMBULATORY_CARE_PROVIDER_SITE_OTHER): Payer: Medicare Other | Admitting: Diagnostic Neuroimaging

## 2016-04-29 ENCOUNTER — Encounter: Payer: Self-pay | Admitting: Diagnostic Neuroimaging

## 2016-04-29 VITALS — BP 128/65 | HR 73 | Wt 192.0 lb

## 2016-04-29 DIAGNOSIS — M791 Myalgia, unspecified site: Secondary | ICD-10-CM

## 2016-04-29 DIAGNOSIS — M25561 Pain in right knee: Secondary | ICD-10-CM

## 2016-04-29 DIAGNOSIS — R6889 Other general symptoms and signs: Secondary | ICD-10-CM | POA: Diagnosis not present

## 2016-04-29 DIAGNOSIS — G609 Hereditary and idiopathic neuropathy, unspecified: Secondary | ICD-10-CM

## 2016-04-29 DIAGNOSIS — M25562 Pain in left knee: Secondary | ICD-10-CM

## 2016-04-29 NOTE — Progress Notes (Signed)
[facility]  PATIENT: Nancy Kline DOB: January 21, 1946  REFERRING CLINICIAN: Harris HISTORY FROM: patient  REASON FOR VISIT: follow up   HISTORICAL  CHIEF COMPLAINT:  Chief Complaint  Patient presents with  . Peripheral Neuropathy    rm 7, "gotten worse; neuropathy makes it difficult to move but walking helps w/pain; driving is very painful; swelling sensation from tip of toes to thigh"  . RLS  . Follow-up    last seen 72/2017    HISTORY OF PRESENT ILLNESS:   UPDATE 04/29/16: Since last visit, symptoms continue. Cold intolerance, pain in legs, numbness, stinging, burning pain. Balance is off. Poor sleep.   UPDATE 09/03/15: Since last visit, tried gabapentin and naproxen (1 month) then developed hair loss. Then stopped gabapentin and naproxen. Now on OTC motrin, which is helping more. Neuropathy cream helping a little. Now with left knee pain.   PRIOR HPI (07/02/15): 70 year old left-handed female here for evaluation of lower extremity pain. For past 2 months patient has had intermittent pain, cramps, numbness and tingling in the feet, legs, calves, upper legs and low back. Symptoms are worse in the evening especially when she lays down or sits down. Symptoms are slightly improved she is up and moving around. Cold weather seems to aggravate her symptoms. Patient had MRI lumbar spine which was unremarkable. Patient referred to me for further evaluation. Patient also complaining of long-standing numbness, tingling and pain in her fingers and hands since the 1980s. She thinks she has had carpal tunnel diagnosis from the past, and has been using response intermittently. She does not want to have nerve conduction studies at this time.   REVIEW OF SYSTEMS: Full 14 system review of systems performed and notable only for joint pain back pain aching muscles restless legs.    ALLERGIES: Allergies  Allergen Reactions  . Celebrex [Celecoxib] Other (See Comments)   hallucinations  . Darvocet [Propoxyphene N-Acetaminophen] Other (See Comments)    hallucinations  . Flexeril [Cyclobenzaprine Hcl] Other (See Comments)    hallucinations  . Pregabalin Other (See Comments)    hallucinations    HOME MEDICATIONS: Outpatient Medications Prior to Visit  Medication Sig Dispense Refill  . BIOTIN PO Take 1 tablet by mouth daily.    . calcium acetate (PHOSLO) 667 MG capsule Take 667 mg by mouth 3 (three) times daily with meals.    . cholecalciferol (VITAMIN D) 1000 UNITS tablet Take 1,000 Units by mouth daily.      . Efinaconazole 10 % SOLN Apply 1 drop topically daily. 4 mL 11  . fish oil-omega-3 fatty acids 1000 MG capsule Take 2 g by mouth daily.      . fluticasone (FLONASE) 50 MCG/ACT nasal spray 2 sprays daily.    Marland Kitchen glucosamine-chondroitin 500-400 MG tablet Take 1 tablet by mouth daily.      Marland Kitchen ibuprofen (ADVIL,MOTRIN) 200 MG tablet Take 200 mg by mouth every 6 (six) hours as needed.    . magnesium 30 MG tablet Take 30 mg by mouth daily.      . meclizine (ANTIVERT) 50 MG tablet Take 50 mg by mouth 3 (three) times daily as needed.    . Multiple Vitamin (MULTIVITAMIN WITH MINERALS) TABS Take 1 tablet by mouth daily.    . NONFORMULARY OR COMPOUNDED ITEM APPLY 1 TO 2 GRAMS [2 TO 4 PUMPS] TO THE AFFECTED AREA 3 TO 4 TIMES A DAY (1 PUMP = 0.5 GRAM)  12  . pantoprazole (PROTONIX) 40 MG tablet Take 1 tablet  by mouth daily.    . baclofen (LIORESAL) 10 MG tablet Take 10 mg by mouth daily. Pt unsure of dose; will call to inform    . gabapentin (NEURONTIN) 100 MG capsule Take 1-3 capsules (100-300 mg total) by mouth at bedtime. (Patient not taking: Reported on 09/03/2015) 90 capsule 6  . naproxen (NAPROSYN) 500 MG tablet 500 mg. Reported on 09/03/2015    . terbinafine (LAMISIL) 250 MG tablet Take 1 tablet (250 mg total) by mouth daily. 90 tablet 0   No facility-administered medications prior to visit.     PAST MEDICAL HISTORY: Past Medical History:  Diagnosis Date    . Atrial fibrillation (HCC)   . Carpal tunnel syndrome   . Chronic back pain   . GERD (gastroesophageal reflux disease)   . Hyperlipidemia   . Mitral valve prolapse   . Overactive bladder   . Palpitations   . Peripheral neuropathy (HCC)   . RLS (restless legs syndrome)   . Sciatica   . Vertigo     PAST SURGICAL HISTORY: Past Surgical History:  Procedure Laterality Date  . CARDIAC SURGERY     ablation    FAMILY HISTORY: Family History  Problem Relation Age of Onset  . Hypertension Mother   . Dementia Mother     SOCIAL HISTORY:  Social History   Social History  . Marital status: Married    Spouse name: Tomasa RandGleenreus Bohr Sr.   . Number of children: 2  . Years of education: BS   Occupational History  . Retired Retired   Social History Main Topics  . Smoking status: Never Smoker  . Smokeless tobacco: Never Used  . Alcohol use No  . Drug use: No  . Sexual activity: Not on file   Other Topics Concern  . Not on file   Social History Narrative   [name]   Caffeine Use: 1 cup of coffee monthly.      PHYSICAL EXAM  GENERAL EXAM/CONSTITUTIONAL: Vitals:  Vitals:   04/29/16 0934  BP: 128/65  Pulse: 73  Weight: 192 lb (87.1 kg)   Body mass index is 30.07 kg/m. No exam data present  Patient is in no distress; well developed, nourished and groomed; neck is supple  CARDIOVASCULAR:  Examination of carotid arteries is normal; no carotid bruits  Regular rate and rhythm, no murmurs  Examination of peripheral vascular system by observation and palpation is normal  EYES:  Ophthalmoscopic exam of optic discs and posterior segments is normal; no papilledema or hemorrhages  MUSCULOSKELETAL:  Gait, strength, tone, movements noted in Neurologic exam below  NEUROLOGIC: MENTAL STATUS:  No flowsheet data found.  awake, alert, oriented to person, place and time  recent and remote memory intact  normal attention and  concentration  language fluent, comprehension intact, naming intact,   fund of knowledge appropriate  CRANIAL NERVE:    2nd, 3rd, 4th, 6th - pupils equal and reactive to light, visual fields full to confrontation, extraocular muscles intact, no nystagmus  5th - facial sensation symmetric  7th - facial strength symmetric  8th - hearing intact  9th - palate elevates symmetrically, uvula midline  11th - shoulder shrug symmetric  12th - tongue protrusion midline  MOTOR:   normal bulk and tone, full strength in the BUE, BLE  SENSORY:   normal and symmetric to light touch, vibration and temperature  COORDINATION:   finger-nose-finger, fine finger movements normal  REFLEXES:   deep tendon reflexes TRACE  and symmetric; ABSENT AT ANKLES  GAIT/STATION:   narrow based gait; ANTALGIC GAIT; LIMPING ON LEFT KNEE     DIAGNOSTIC DATA (LABS, IMAGING, TESTING) - I reviewed patient records, labs, notes, testing and imaging myself where available.  Lab Results  Component Value Date   WBC 5.2 10/29/2014   HGB 11.1 10/29/2014   HCT 34.0 10/29/2014   MCV 97 10/29/2014   PLT 231 10/29/2014      Component Value Date/Time   NA 136 05/08/2011 0618   K 4.2 05/08/2011 0618   CL 102 05/08/2011 0618   CO2 25 05/08/2011 0618   GLUCOSE 111 (H) 05/08/2011 0618   BUN 14 05/08/2011 0618   CREATININE 0.85 05/08/2011 0618   CALCIUM 9.4 05/08/2011 0618   PROT 6.9 10/29/2014 1545   ALBUMIN 4.3 10/29/2014 1545   AST 14 10/29/2014 1545   ALT 8 10/29/2014 1545   ALKPHOS 57 10/29/2014 1545   BILITOT 0.4 10/29/2014 1545   GFRNONAA 70 (L) 05/08/2011 0618   GFRAA 81 (L) 05/08/2011 0618   No results found for: CHOL, HDL, LDLCALC, LDLDIRECT, TRIG, CHOLHDL Lab Results  Component Value Date   HGBA1C 5.9 (H) 07/02/2015   Lab Results  Component Value Date   VITAMINB12 >2000 (H) 07/02/2015   Lab Results  Component Value Date   TSH 2.630 07/02/2015    05/22/15 MRI lumbar spine [I  reviewed images myself and agree with interpretation. -VRP]  1. At L2-3 there is a mild broad-based disc bulge. 2. At L3-4 there is a mild broad-based disc bulge flattening the ventral thecal sac. 3. At L4-5 there is a moderate broad-based disc bulge flattening the ventral thecal sac and bilateral foraminal narrowing. 4. At L5-S1 there is mild broad-based disc bulge with mild bilateral facet arthropathy.  08/29/15 MRI left knee - Degenerative maceration of the posterior 1/2 of the body of the medial meniscus. - Tricompartmental osteoarthritis appearing worst in the medial compartment where there is near bone-on-bone joint space narrowing.  - Severe mucoid degeneration of the ACL.     ASSESSMENT AND PLAN  70 y.o. year old female here with pain and cramps in legs. 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 Slightly better on neuropathy cream and OTC motrin. Intolerant of gabapentin.  Ddx: neuropathy vs myopathy/myalgia vs arthritis  Hereditary and idiopathic peripheral neuropathy - Plan: Hemoglobin A1c, TSH, Vitamin B12, ANA w/Reflex if Positive, CK, Aldolase, Sedimentation Rate, C-reactive Protein  Arthralgia of both lower legs - Plan: Hemoglobin A1c, TSH, Vitamin B12, ANA w/Reflex if Positive, CK, Aldolase, Sedimentation Rate, C-reactive Protein  Cold intolerance - Plan: Hemoglobin A1c, TSH, Vitamin B12, ANA w/Reflex if Positive, CK, Aldolase, Sedimentation Rate, C-reactive Protein  Myalgia - Plan: Hemoglobin A1c, TSH, Vitamin B12, ANA w/Reflex if Positive, CK, Aldolase, Sedimentation Rate, C-reactive Protein     PLAN: I spent 25 minutes of face to face time with patient. Greater than 50% of time was spent in counseling and coordination of care with patient. In summary we discussed: - try OTC b-complex vitamin and alpha lipoic acid supplements - continue neuropathy cream and motrin OTC - offered EMG/NCS; patient declines at this time (due to concern about  intolerance of testing) - follow up with orthopedics re: knee degenerative changes - consider rheumatology evaluation for arthritis, cold intolerance, diffuse pain evaluation  Return in about 3 months (around 07/30/2016).    Suanne Marker, MD 04/29/2016, 10:22 AM Certified in Neurology, Neurophysiology and Neuroimaging  [facility]  [address] [place], [place] [address] [phone]

## 2016-04-29 NOTE — Patient Instructions (Signed)
Thank you for coming to see [place] at [facility]. I hope we have been able to provide you high quality care today.  You may receive a patient satisfaction survey over the next few weeks. We would appreciate your feedback and comments so that we may continue to improve ourselves and the health of our patients.  - try vitamin B-complex and alpha-lipoic acid supplements  - I will check lab testing today  - follow up with orthopedic clinic regarding knee pain issues  - consider rheumatology evaluation    ~~~~~~~~~~~~~~~~~~~~~~~~~~~~~~~~~~~~~~~~~~~~~~~~~~~~~~~~~~~~~~~~~  DR. PENUMALLI'S GUIDE TO HAPPY AND HEALTHY LIVING These are some of my general health and wellness recommendations. Some of them may apply to you better than others. Please use common sense as you try these suggestions and feel free to ask me any questions.   ACTIVITY/FITNESS Mental, social, emotional and physical stimulation are very important for brain and body health. Try learning a new activity (arts, music, language, sports, games).  Keep moving your body to the best of your abilities. You can do this at home, inside or outside, the park, community center, gym or anywhere you like. Consider a physical therapist or personal trainer to get started. Consider the app Sworkit. Fitness trackers such as smart-watches, smart-phones or Fitbits can help as well.   NUTRITION Eat more plants: colorful vegetables, nuts, seeds and berries.  Eat less sugar, salt, preservatives and processed foods.  Avoid toxins such as cigarettes and alcohol.  Drink water when you are thirsty. Warm water with a slice of lemon is an excellent morning drink to start the day.  Consider these websites for more information The Nutrition Source (https://www.henry-hernandez.biz/) Precision Nutrition (WindowBlog.ch)   RELAXATION Consider practicing mindfulness meditation or other relaxation  techniques such as deep breathing, prayer, yoga, tai chi, massage. See website mindful.org or the apps Headspace or Calm to help get started.   SLEEP Try to get at least 7-8+ hours sleep per day. Regular exercise and reduced caffeine will help you sleep better. Practice good sleep hygeine techniques. See website sleep.org for more information.   PLANNING Prepare estate planning, living will, healthcare POA documents. Sometimes this is best planned with the help of an attorney. Theconversationproject.org and agingwithdignity.org are excellent resources.

## 2016-04-30 LAB — SEDIMENTATION RATE: SED RATE: 36 mm/h (ref 0–40)

## 2016-04-30 LAB — ANA W/REFLEX IF POSITIVE: ANA: NEGATIVE

## 2016-04-30 LAB — VITAMIN B12: Vitamin B-12: 1188 pg/mL — ABNORMAL HIGH (ref 211–946)

## 2016-04-30 LAB — HEMOGLOBIN A1C
ESTIMATED AVERAGE GLUCOSE: 120 mg/dL
HEMOGLOBIN A1C: 5.8 % — AB (ref 4.8–5.6)

## 2016-04-30 LAB — ALDOLASE: Aldolase: 4.7 U/L (ref 3.3–10.3)

## 2016-04-30 LAB — CK: CK TOTAL: 158 U/L (ref 24–173)

## 2016-04-30 LAB — TSH: TSH: 2.16 u[IU]/mL (ref 0.450–4.500)

## 2016-04-30 LAB — C-REACTIVE PROTEIN: CRP: 2.6 mg/L (ref 0.0–4.9)

## 2016-05-01 ENCOUNTER — Telehealth: Payer: Self-pay | Admitting: *Deleted

## 2016-05-01 NOTE — Telephone Encounter (Signed)
Spoke with patient and informed her that her lab results are unremarkable. Hgb A1c elevated but less than it was in previous lab. Advised that Dr Marjory LiesPenumalli will continue with her current treatment plan. She inquired if exercise would help with her pain. Discussed her office visit in which she stated driving and sitting bring on pain, walking helps relieve pain. Advised she may walk, increase her distance, time as she tolerates. Advised that she just monitor how she tolerates walking, moving around and do what she can.  She verbalized understanding, appreciation of call.

## 2016-05-07 ENCOUNTER — Encounter: Payer: Self-pay | Admitting: *Deleted

## 2016-05-07 NOTE — Telephone Encounter (Signed)
Spoke with patient who stated she was trying to tell her daughter about her lab results and was unable to remember well enough to satisfy her daughter. She also stated she wanted her PCP to get results. Informed her this RN will fax results to her PCP. Offered to call her daughter, but patient declined. She requested a copy be mailed to her. This RN reviewed each result with her again. Advised her that her Hgb A1c is improved from previous result. Her Vit B12 is high but this is of no health concern for her. Other labs are unremarkable. She verbalized understanding, appreciation for call.

## 2016-05-07 NOTE — Telephone Encounter (Signed)
Pt called in asking for a written explanation about her lab work.She would also [facility] Dr. Tiburcio PeaHarris. Please call and advise

## 2016-07-02 ENCOUNTER — Encounter (HOSPITAL_COMMUNITY): Payer: Self-pay

## 2016-07-02 ENCOUNTER — Emergency Department (HOSPITAL_COMMUNITY): Payer: Medicare Other

## 2016-07-02 ENCOUNTER — Emergency Department (HOSPITAL_COMMUNITY)
Admission: EM | Admit: 2016-07-02 | Discharge: 2016-07-02 | Disposition: A | Discharge status: Home / Self Care | Payer: Medicare Other | Attending: Emergency Medicine | Admitting: Emergency Medicine

## 2016-07-02 DIAGNOSIS — Z7901 Long term (current) use of anticoagulants: Secondary | ICD-10-CM | POA: Insufficient documentation

## 2016-07-02 DIAGNOSIS — I480 Paroxysmal atrial fibrillation: Secondary | ICD-10-CM | POA: Insufficient documentation

## 2016-07-02 DIAGNOSIS — I4891 Unspecified atrial fibrillation: Secondary | ICD-10-CM | POA: Diagnosis present

## 2016-07-02 LAB — CBC
HEMATOCRIT: 34.8 % — AB (ref 36.0–46.0)
HEMOGLOBIN: 11.5 g/dL — AB (ref 12.0–15.0)
MCH: 33 pg (ref 26.0–34.0)
MCHC: 33 g/dL (ref 30.0–36.0)
MCV: 100 fL (ref 78.0–100.0)
Platelets: 212 10*3/uL (ref 150–400)
RBC: 3.48 MIL/uL — ABNORMAL LOW (ref 3.87–5.11)
RDW: 13 % (ref 11.5–15.5)
WBC: 5 10*3/uL (ref 4.0–10.5)

## 2016-07-02 LAB — BASIC METABOLIC PANEL
ANION GAP: 9 (ref 5–15)
BUN: 9 mg/dL (ref 6–20)
CHLORIDE: 105 mmol/L (ref 101–111)
CO2: 24 mmol/L (ref 22–32)
CREATININE: 0.95 mg/dL (ref 0.44–1.00)
Calcium: 9.3 mg/dL (ref 8.9–10.3)
GFR calc non Af Amer: 58 mL/min — ABNORMAL LOW (ref >60)
Glucose, Bld: 97 mg/dL (ref 65–99)
Potassium: 3.5 mmol/L (ref 3.5–5.1)
SODIUM: 138 mmol/L (ref 135–145)

## 2016-07-02 LAB — MAGNESIUM: Magnesium: 2.1 mg/dL (ref 1.7–2.4)

## 2016-07-02 LAB — TSH: TSH: 1.419 u[IU]/mL (ref 0.350–4.500)

## 2016-07-02 LAB — TROPONIN I

## 2016-07-02 MED ORDER — RIVAROXABAN 15 MG PO TABS: 15.0000 mg | ORAL_TABLET | Freq: Once | ORAL | Status: DC

## 2016-07-02 MED ORDER — PROPRANOLOL HCL 10 MG PO TABS: 10.0000 mg | ORAL_TABLET | Freq: Four times a day (QID) | ORAL | 0 refills | Status: DC | PRN

## 2016-07-02 MED ORDER — RIVAROXABAN 20 MG PO TABS: 20.0000 mg | ORAL_TABLET | Freq: Every day | ORAL | 0 refills | Status: DC

## 2016-07-02 MED ORDER — DILTIAZEM HCL 25 MG/5ML IV SOLN: 20.0000 mg | Freq: Once | INTRAVENOUS | Status: DC

## 2016-07-02 MED ORDER — RIVAROXABAN 20 MG PO TABS
20.0000 mg | ORAL_TABLET | Freq: Once | ORAL | Status: AC
  Administered 2016-07-02: 20 mg via ORAL
  Filled 2016-07-02: qty 1

## 2016-07-02 MED ORDER — RIVAROXABAN (XARELTO) EDUCATION KIT FOR AFIB PATIENTS
PACK | Freq: Once | Status: AC
  Administered 2016-07-02: 20:00:00
  Filled 2016-07-02: qty 1

## 2016-07-02 NOTE — ED Provider Notes (Signed)
[facility] DEPT Provider Note   CSN: [id number] Arrival date & time: 07/02/16  1626     History   Chief Complaint Chief Complaint  Patient presents with  . Atrial Fibrillation    HPI Nancy Kline is a 70 y.o. female.  Pt presents to the ED today by EMS due to a.fib with RVR.  Pt has had episodes of palpitations for the past few weeks.  She has had associated sob.  She went to her pcp today and was found to be in a.fib.  She has a hx of a.fib, but no episodes for awhile.  She had a cardiac ablation in 2012 and had been followed by Dr. Lovena Le.  She has not seen him since 2014 since she has been asymptomatic.  The pt was given 324 mg of asa by EMS.   She has not been given any rate control medications yet.  Pt was on coumadin when she had the a.fib in the past.  CHADVASC 2. score:      Past Medical History:  Diagnosis Date  . Atrial fibrillation (Nancy Kline)   . Carpal tunnel syndrome   . Chronic back pain   . GERD (gastroesophageal reflux disease)   . Hyperlipidemia   . Mitral valve prolapse   . Overactive bladder   . Palpitations   . Peripheral neuropathy (Nancy Kline)   . RLS (restless legs syndrome)   . Sciatica   . Vertigo     Patient Active Problem List   Diagnosis Date Noted  . RLS (restless legs syndrome) 07/02/2015  . Hereditary and idiopathic peripheral neuropathy 07/02/2015  . Chest pain 04/28/2013  . Headache(784.0) 11/15/2012  . Dizziness and giddiness 11/15/2012  . Claudication (Nancy Kline) 06/09/2011  . Atrial flutter (Nancy Kline) 05/12/2011  . Long term (current) use of anticoagulants 05/12/2011    Past Surgical History:  Procedure Laterality Date  . CARDIAC SURGERY     ablation    OB History    No data available       Home Medications    Prior to Admission medications   Medication Sig Start Date End Date Taking? Authorizing Provider  BIOTIN PO Take 1 tablet by mouth daily.    Historical Provider, Kline  calcium acetate (PHOSLO) 667 MG capsule Take 667 mg by  mouth 3 (three) times daily with meals.    Historical Provider, Kline  cholecalciferol (VITAMIN D) 1000 UNITS tablet Take 1,000 Units by mouth daily.      Historical Provider, Kline  clonazePAM (KLONOPIN) 0.5 MG tablet Take 0.5 mg by mouth at bedtime. 03/31/16   Historical Provider, Kline  Efinaconazole 10 % SOLN Apply 1 drop topically daily. 10/19/13   Max T Hyatt, DPM  fish oil-omega-3 fatty acids 1000 MG capsule Take 2 g by mouth daily.      Historical Provider, Kline  fluticasone (FLONASE) 50 MCG/ACT nasal spray 2 sprays daily. 08/29/12   Historical Provider, Kline  glucosamine-chondroitin 500-400 MG tablet Take 1 tablet by mouth daily.      Historical Provider, Kline  ibuprofen (ADVIL,MOTRIN) 200 MG tablet Take 200 mg by mouth every 6 (six) hours as needed.    Historical Provider, Kline  magnesium 30 MG tablet Take 30 mg by mouth daily.      Historical Provider, Kline  meclizine (ANTIVERT) 50 MG tablet Take 50 mg by mouth 3 (three) times daily as needed.    Historical Provider, Kline  Multiple Vitamin (MULTIVITAMIN WITH MINERALS) TABS Take 1 tablet by mouth daily.  Historical Provider, Kline  NONFORMULARY OR COMPOUNDED ITEM APPLY 1 TO 2 GRAMS [2 TO 4 PUMPS] TO THE AFFECTED AREA 3 TO 4 TIMES A DAY (1 PUMP = 0.5 GRAM) 07/03/15   Historical Provider, Kline  pantoprazole (PROTONIX) 40 MG tablet Take 1 tablet by mouth daily. 10/22/12   Historical Provider, Kline  propranolol (INDERAL) 10 MG tablet Take 1 tablet (10 mg total) by mouth 4 (four) times daily as needed (rapid hr). 07/02/16   Nancy Pence, Kline  rivaroxaban (XARELTO) 20 MG TABS tablet Take 1 tablet (20 mg total) by mouth daily with supper. 07/02/16   Nancy Pence, Kline  TRAVATAN Z 0.004 % SOLN ophthalmic solution  02/21/16   Historical Provider, Kline    Family History Family History  Problem Relation Age of Onset  . Hypertension Mother   . Dementia Mother     Social History Social History  Substance Use Topics  . Smoking status: Never Smoker  . Smokeless tobacco:  Never Used  . Alcohol use No     Allergies   Celebrex [celecoxib]; Darvocet [propoxyphene n-acetaminophen]; Flexeril [cyclobenzaprine hcl]; and Pregabalin   Review of Systems Review of Systems  Respiratory: Positive for shortness of breath.   Cardiovascular: Positive for palpitations.  All other systems reviewed and are negative.    Physical Exam Updated Vital Signs BP (!) 107/52   Pulse 74   Temp 98.8 F (37.1 C) (Oral)   Resp 20   Ht 5' 5"  (1.651 m)   Wt 188 lb (85.3 kg)   SpO2 100%   BMI 31.28 kg/m   Physical Exam  Constitutional: She is oriented to person, place, and time. She appears well-developed and well-nourished.  HENT:  Head: Normocephalic and atraumatic.  Right Ear: External ear normal.  Left Ear: External ear normal.  Nose: Nose normal.  Mouth/Throat: Oropharynx is clear and moist.  Eyes: Conjunctivae and EOM are normal. Pupils are equal, round, and reactive to light.  Neck: Normal range of motion. Neck supple.  Cardiovascular: Normal heart sounds and intact distal pulses.  An irregularly irregular rhythm present. Tachycardia present.   Pulmonary/Chest: Effort normal and breath sounds normal.  Abdominal: Soft. Bowel sounds are normal.  Musculoskeletal: Normal range of motion.  Neurological: She is alert and oriented to person, place, and time.  Skin: Skin is warm.  Psychiatric: She has a normal mood and affect. Her behavior is normal. Judgment and thought content normal.  Nursing note and vitals reviewed.    ED Treatments / Results  Labs (all labs ordered are listed, but only abnormal results are displayed) Labs Reviewed  CBC - Abnormal; Notable for the following:       Result Value   RBC 3.48 (*)    Hemoglobin 11.5 (*)    HCT 34.8 (*)    All other components within normal limits  BASIC METABOLIC PANEL - Abnormal; Notable for the following:    GFR calc non Af Amer 58 (*)    All other components within normal limits  MAGNESIUM  TSH    TROPONIN I    EKG  EKG Interpretation  Date/Time:  Thursday July 02 2016 16:40:50 EST Ventricular Rate:  89 PR Interval:    QRS Duration: 86 QT Interval:  342 QTC Calculation: 417 R Axis:     Text Interpretation:  Sinus rhythm Confirmed by Nancy Kline, Nancy Kline ([id number]) on 07/02/2016 5:15:47 PM       Radiology Dg Chest 2 View  Result Date: 07/02/2016 CLINICAL DATA:  Heart  palpitations EXAM: CHEST  2 VIEW COMPARISON:  05/05/2011 FINDINGS: The heart size and mediastinal contours are within normal limits. Both lungs are clear. The visualized skeletal structures are unremarkable. IMPRESSION: No active cardiopulmonary disease. Electronically Signed   By: Donavan Foil M.D.   On: 07/02/2016 17:17    Procedures Procedures (including critical care time)  Medications Ordered in ED Medications  rivaroxaban (XARELTO) Education Kit for Afib patients (not administered)  rivaroxaban (XARELTO) tablet 20 mg (not administered)     Initial Impression / Assessment and Plan / ED Course  I have reviewed the triage vital signs and the nursing notes.  Pertinent labs & imaging results that were available during my care of the patient were reviewed by me and considered in my medical decision making (see chart for details).  Clinical Course    I initially ordered cardizem to slow pt's heart rate, but by the time the nurse went to give it, she was back in NSR, so she was not given that medication.  Pt has remained in NSR while here.  Pt d/w Dr. Cathie Olden (cards) who recommended Xarelto and prn propranolol.  He said for her to call the office for an appt.  The pt is in agreement with this plan.  Final Clinical Impressions(s) / ED Diagnoses   Final diagnoses:  Paroxysmal atrial fibrillation (HCC)    New Prescriptions New Prescriptions   PROPRANOLOL (INDERAL) 10 MG TABLET    Take 1 tablet (10 mg total) by mouth 4 (four) times daily as needed (rapid hr).   RIVAROXABAN (XARELTO) 20 MG TABS  TABLET    Take 1 tablet (20 mg total) by mouth daily with supper.     Nancy Pence, Kline 07/02/16 [phone]

## 2016-07-02 NOTE — ED Notes (Signed)
Pt ambulated to restroom. Tolerated well.

## 2016-07-02 NOTE — ED Notes (Signed)
Pt stable, understands discharge instructions, and reasons for return.   

## 2016-07-02 NOTE — ED Triage Notes (Signed)
Pt brought in [facility]. Pt was going to doctors office and felt heart palpitations. Pt was picked up by [facility] in afib with RVR. Pt denies chest pain. Pt c/o some SOB. Pt has hx of a flutter in 2013. Pt given 324 ASA on transport by EMS.

## 2016-07-03 ENCOUNTER — Telehealth (HOSPITAL_COMMUNITY): Payer: Self-pay | Admitting: *Deleted

## 2016-07-03 NOTE — Telephone Encounter (Signed)
LMOM for pt to either contact Dr. Bruna Potteraylors office for f/u of afib or to contact our clinic to follow up

## 2016-07-08 ENCOUNTER — Telehealth: Payer: Self-pay

## 2016-07-08 NOTE — Telephone Encounter (Signed)
SENT NOTES TO SCHEDULING 

## 2016-07-15 ENCOUNTER — Encounter: Payer: Self-pay | Admitting: Internal Medicine

## 2016-07-15 ENCOUNTER — Ambulatory Visit (INDEPENDENT_AMBULATORY_CARE_PROVIDER_SITE_OTHER): Payer: Medicare Other | Admitting: Internal Medicine

## 2016-07-15 VITALS — BP 126/74 | HR 84 | Ht 65.5 in | Wt 187.6 lb

## 2016-07-15 DIAGNOSIS — I739 Peripheral vascular disease, unspecified: Secondary | ICD-10-CM | POA: Diagnosis not present

## 2016-07-15 DIAGNOSIS — R079 Chest pain, unspecified: Secondary | ICD-10-CM | POA: Diagnosis not present

## 2016-07-15 DIAGNOSIS — I4892 Unspecified atrial flutter: Secondary | ICD-10-CM

## 2016-07-15 MED ORDER — RIVAROXABAN 20 MG PO TABS: 20.0000 mg | ORAL_TABLET | Freq: Every day | ORAL | 6 refills | Status: DC

## 2016-07-15 NOTE — Addendum Note (Signed)
Addended by: Juanita LasterGEORGE, TANYA R on: 07/15/2016 03:48 PM   Modules accepted: Orders

## 2016-07-15 NOTE — Progress Notes (Signed)
HPI [name] returns today for followup after a 3 year absence from our arrhythmia clinic. She is a very pleasant 71 year old woman with a history of atrial flutter status post catheter ablation.  The patient has not been seen by me for over 3 years. She was in her primary MD's office and found to be in atrial fib. She was sent to the ER and spontaneously resturned to NSR. She was placed on Xarelto. She c/o feeling poorly. She denies chest pain or sob. She has leg pain which is present at times when she walks but at other times as well. She c/o fatigue and weakness as well and a lack of energy.  Allergies  Allergen Reactions  . Carisoprodol Other (See Comments)  . Celebrex [Celecoxib] Other (See Comments)    hallucinations  . Darvocet [Propoxyphene N-Acetaminophen] Other (See Comments)    hallucinations  . Flexeril [Cyclobenzaprine Hcl] Other (See Comments)    hallucinations  . Other Other (See Comments)  . Pregabalin Other (See Comments)    hallucinations     Current Outpatient Prescriptions  Medication Sig Dispense Refill  . BIOTIN PO Take 1 tablet by mouth daily.    . calcium acetate (PHOSLO) 667 MG capsule Take 667 mg by mouth 3 (three) times daily with meals.    . cholecalciferol (VITAMIN D) 1000 UNITS tablet Take 1,000 Units by mouth daily.      . clonazePAM (KLONOPIN) 0.5 MG tablet Take 0.5 mg by mouth at bedtime.    Marland Kitchen. glucosamine-chondroitin 500-400 MG tablet Take 1 tablet by mouth daily.      . meclizine (ANTIVERT) 50 MG tablet Take 50 mg by mouth 3 (three) times daily as needed.    . NONFORMULARY OR COMPOUNDED ITEM APPLY 1 TO 2 GRAMS [2 TO 4 PUMPS] TO THE AFFECTED AREA 3 TO 4 TIMES A DAY (1 PUMP = 0.5 GRAM)  12  . pantoprazole (PROTONIX) 40 MG tablet Take 1 tablet by mouth daily.    . propranolol (INDERAL) 10 MG tablet Take 1 tablet (10 mg total) by mouth 4 (four) times daily as needed (rapid hr). 30 tablet 0  . rivaroxaban (XARELTO) 20 MG TABS tablet Take 1 tablet (20 mg  total) by mouth daily with supper. 30 tablet 0  . TRAVATAN Z 0.004 % SOLN ophthalmic solution Place 1 drop into both eyes at bedtime.      No current facility-administered medications for this visit.      Past Medical History:  Diagnosis Date  . Atrial fibrillation (HCC)   . Carpal tunnel syndrome   . Chronic back pain   . GERD (gastroesophageal reflux disease)   . Hyperlipidemia   . Mitral valve prolapse   . Overactive bladder   . Palpitations   . Peripheral neuropathy (HCC)   . RLS (restless legs syndrome)   . Sciatica   . Vertigo     ROS:   All systems reviewed and negative except as noted in the HPI.   Past Surgical History:  Procedure Laterality Date  . CARDIAC SURGERY     ablation     Family History  Problem Relation Age of Onset  . Hypertension Mother   . Dementia Mother      Social History   Social History  . Marital status: Married    Spouse name: Tomasa RandGleenreus Andress Sr.   . Number of children: 2  . Years of education: BS   Occupational History  . Retired Retired   Chief Executive Officerocial History [employer]  Topics  . Smoking status: Never Smoker  . Smokeless tobacco: Never Used  . Alcohol use No  . Drug use: No  . Sexual activity: Not on file   Other Topics Concern  . Not on file   Social History Narrative   [name]   Caffeine Use: 1 cup of coffee monthly.      BP 126/74   Pulse 84   Ht 5' 5.5" (1.664 m)   Wt 187 lb 9.6 oz (85.1 kg)   BMI 30.74 kg/m   Physical Exam:  Well appearing 71 year old woman, NAD HEENT: Unremarkable Neck:  7 cm JVD, no thyromegally Back:  No CVA tenderness Lungs:  Clear with no wheezes, rales, or rhonchi. HEART:  Regular rate rhythm, no murmurs, no rubs, no clicks Abd:  soft, positive bowel sounds, no organomegally, no rebound, no guarding Ext:  2 plus pulses, no edema, no cyanosis, no clubbing Skin:  No rashes no nodules Neuro:  CN II through XII intact, motor grossly intact  EKG Normal sinus  rhythm  Assess/Plan:  1. Atrial fib - she is back in rhythm. She has as needed beta blockers. I would recommend watchful waiting for now. No AA drug therapy at this point. She will continue her current meds. 2. Leg pain - I am not sure what the mechanism of her pain is will check dopplers of the lower extremities with ABI's. 3. Fatigue and weakness - she will have a CBC to make sure she is not having occult bleeding.  Leonia Reeves.D.

## 2016-07-15 NOTE — Addendum Note (Signed)
Addended by: Juanita LasterGEORGE, Alejandra Hunt R on: 07/15/2016 03:13 PM   Modules accepted: Orders

## 2016-07-15 NOTE — Addendum Note (Signed)
Addended by: Dennis BastLANIER, KELLY F on: 07/15/2016 04:50 PM   Modules accepted: Orders

## 2016-07-15 NOTE — Patient Instructions (Addendum)
Medication Instructions:  Your physician recommends that you continue on your current medications as directed. Please refer to the Current Medication list given to you today.   Labwork: TODAY: TSH / CBC w/ diff   Testing/Procedures: Duplex lower extremities   Follow-Up: Your physician recommends that you schedule a follow-up appointment in: 3 months with Dr. Ladona Ridgelaylor   Any Other Special Instructions Will Be Listed Below (If Applicable).     If you need a refill on your cardiac medications before your next appointment, please call your pharmacy.

## 2016-07-16 ENCOUNTER — Telehealth: Payer: Self-pay | Admitting: Diagnostic Neuroimaging

## 2016-07-16 LAB — CBC WITH DIFFERENTIAL/PLATELET
BASOS ABS: 0 10*3/uL (ref 0.0–0.2)
Basos: 0 %
EOS (ABSOLUTE): 0.1 10*3/uL (ref 0.0–0.4)
Eos: 2 %
Hematocrit: 33.4 % — ABNORMAL LOW (ref 34.0–46.6)
Hemoglobin: 10.9 g/dL — ABNORMAL LOW (ref 11.1–15.9)
IMMATURE GRANS (ABS): 0 10*3/uL (ref 0.0–0.1)
Immature Granulocytes: 0 %
LYMPHS ABS: 1.9 10*3/uL (ref 0.7–3.1)
LYMPHS: 33 %
MCH: 31.9 pg (ref 26.6–33.0)
MCHC: 32.6 g/dL (ref 31.5–35.7)
MCV: 98 fL — ABNORMAL HIGH (ref 79–97)
Monocytes Absolute: 0.6 10*3/uL (ref 0.1–0.9)
Monocytes: 11 %
NEUTROS ABS: 3.1 10*3/uL (ref 1.4–7.0)
Neutrophils: 54 %
PLATELETS: 266 10*3/uL (ref 150–379)
RBC: 3.42 x10E6/uL — ABNORMAL LOW (ref 3.77–5.28)
RDW: 14 % (ref 12.3–15.4)
WBC: 5.7 10*3/uL (ref 3.4–10.8)

## 2016-07-16 LAB — TSH: TSH: 2.31 u[IU]/mL (ref 0.450–4.500)

## 2016-07-16 NOTE — Telephone Encounter (Signed)
Pt said she cannot get any relief from the neuropathy. She wanted her daughter to come to appt on Monday but she will have to take children to school. She is wanting to be able to have her daughter on the phone during the OV with Dr Demetrius CharityP.  Pt is requesting RN to call

## 2016-07-17 ENCOUNTER — Other Ambulatory Visit: Payer: Self-pay | Admitting: Internal Medicine

## 2016-07-17 DIAGNOSIS — I739 Peripheral vascular disease, unspecified: Secondary | ICD-10-CM

## 2016-07-17 NOTE — Telephone Encounter (Signed)
Spoke with patient who stated her daughter has two young children to get to school and lives in [place]. That is why she cannot be with patient during the appointment. Patient rescheduled her FU due to continued neuropathy pain in her legs. This RN advised Dr Marjory LiesPenumalli can speak with her daughter while she is here. Patient verbalzied understanding, appreciation.

## 2016-07-20 ENCOUNTER — Ambulatory Visit (INDEPENDENT_AMBULATORY_CARE_PROVIDER_SITE_OTHER): Payer: Medicare Other | Admitting: Diagnostic Neuroimaging

## 2016-07-20 ENCOUNTER — Encounter: Payer: Self-pay | Admitting: Diagnostic Neuroimaging

## 2016-07-20 VITALS — BP 115/61 | HR 76 | Wt 186.6 lb

## 2016-07-20 DIAGNOSIS — M791 Myalgia, unspecified site: Secondary | ICD-10-CM

## 2016-07-20 DIAGNOSIS — M25561 Pain in right knee: Secondary | ICD-10-CM

## 2016-07-20 DIAGNOSIS — G609 Hereditary and idiopathic neuropathy, unspecified: Secondary | ICD-10-CM

## 2016-07-20 DIAGNOSIS — R6889 Other general symptoms and signs: Secondary | ICD-10-CM

## 2016-07-20 DIAGNOSIS — M25562 Pain in left knee: Secondary | ICD-10-CM

## 2016-07-20 MED ORDER — DULOXETINE HCL 30 MG PO CPEP: 30.0000 mg | ORAL_CAPSULE | Freq: Every day | ORAL | 6 refills | Status: DC

## 2016-07-20 NOTE — Progress Notes (Signed)
[facility]  PATIENT: Nancy Kline DOB: 09-10-1945  REFERRING CLINICIAN: Harris HISTORY FROM: patient  REASON FOR VISIT: follow up   HISTORICAL  CHIEF COMPLAINT:  Chief Complaint  Patient presents with  . Peripheral neuropathy    rm 6, "neuropathy pain in feet much worse, especially in past 2 wks; extrmemly awkward to walk; my balance is terrible"  . Follow-up    3 month    HISTORY OF PRESENT ILLNESS:   UPDATE 07/20/16: Since last visit, symptoms continue. Cramps, leg pain, arthritis continue. Has new dx of atrial fibrillation. Also went to rheumatology (Dr. Kellie Simmering) and had right knee injection with prednisone pack (which helped). Also went to PT and had some benefit with treatments.  UPDATE 04/29/16: Since last visit, symptoms continue. Cold intolerance, pain in legs, numbness, stinging, burning pain. Balance is off. Poor sleep.   UPDATE 09/03/15: Since last visit, tried gabapentin and naproxen (1 month) then developed hair loss. Then stopped gabapentin and naproxen. Now on OTC motrin, which is helping more. Neuropathy cream helping a little. Now with left knee pain.   PRIOR HPI (07/02/15): 71 year old left-handed female here for evaluation of lower extremity pain. For past 2 months patient has had intermittent pain, cramps, numbness and tingling in the feet, legs, calves, upper legs and low back. Symptoms are worse in the evening especially when she lays down or sits down. Symptoms are slightly improved she is up and moving around. Cold weather seems to aggravate her symptoms. Patient had MRI lumbar spine which was unremarkable. Patient referred to me for further evaluation. Patient also complaining of long-standing numbness, tingling and pain in her fingers and hands since the 1980s. She thinks she has had carpal tunnel diagnosis from the past, and has been using response intermittently. She does not want to have nerve conduction studies at this time.   REVIEW  OF SYSTEMS: Full 14 system review of systems performed and negative except: joint swelling only for joint pain back pain aching muscles restless legs.    ALLERGIES: Allergies  Allergen Reactions  . Carisoprodol Other (See Comments)  . Celebrex [Celecoxib] Other (See Comments)    hallucinations  . Darvocet [Propoxyphene N-Acetaminophen] Other (See Comments)    hallucinations  . Flexeril [Cyclobenzaprine Hcl] Other (See Comments)    hallucinations  . Other Other (See Comments)  . Pregabalin Other (See Comments)    hallucinations    HOME MEDICATIONS: Outpatient Medications Prior to Visit  Medication Sig Dispense Refill  . BIOTIN PO Take 1 tablet by mouth daily.    . calcium acetate (PHOSLO) 667 MG capsule Take 667 mg by mouth 3 (three) times daily with meals.    . cholecalciferol (VITAMIN D) 1000 UNITS tablet Take 1,000 Units by mouth daily.      . clonazePAM (KLONOPIN) 0.5 MG tablet Take 0.5 mg by mouth at bedtime.    Marland Kitchen glucosamine-chondroitin 500-400 MG tablet Take 1 tablet by mouth daily.      . meclizine (ANTIVERT) 50 MG tablet Take 50 mg by mouth 3 (three) times daily as needed.    . NONFORMULARY OR COMPOUNDED ITEM APPLY 1 TO 2 GRAMS [2 TO 4 PUMPS] TO THE AFFECTED AREA 3 TO 4 TIMES A DAY (1 PUMP = 0.5 GRAM)  12  . pantoprazole (PROTONIX) 40 MG tablet Take 1 tablet by mouth daily.    . propranolol (INDERAL) 10 MG tablet Take 1 tablet (10 mg total) by mouth 4 (four) times daily as needed (rapid hr). 30  tablet 0  . rivaroxaban (XARELTO) 20 MG TABS tablet Take 1 tablet (20 mg total) by mouth daily with supper. 30 tablet 6  . TRAVATAN Z 0.004 % SOLN ophthalmic solution Place 1 drop into both eyes at bedtime.      No facility-administered medications prior to visit.     PAST MEDICAL HISTORY: Past Medical History:  Diagnosis Date  . Atrial fibrillation (HCC)   . Carpal tunnel syndrome   . Chronic back pain   . GERD (gastroesophageal reflux disease)   . Hyperlipidemia   . Mitral  valve prolapse   . Overactive bladder   . Palpitations   . Peripheral neuropathy (HCC)   . RLS (restless legs syndrome)   . Sciatica   . Vertigo     PAST SURGICAL HISTORY: Past Surgical History:  Procedure Laterality Date  . CARDIAC SURGERY     ablation    FAMILY HISTORY: Family History  Problem Relation Age of Onset  . Hypertension Mother   . Dementia Mother     SOCIAL HISTORY:  Social History   Social History  . Marital status: Married    Spouse name: Genessis Flanary Sr.   . Number of children: 2  . Years of education: BS   Occupational History  . Retired Retired   Social History Main Topics  . Smoking status: Never Smoker  . Smokeless tobacco: Never Used  . Alcohol use No  . Drug use: No  . Sexual activity: Not on file   Other Topics Concern  . Not on file   Social History Narrative   [name]   Caffeine Use: 1 cup of coffee monthly.      PHYSICAL EXAM  GENERAL EXAM/CONSTITUTIONAL: Vitals:  Vitals:   07/20/16 0749  BP: 115/61  Pulse: 76  Weight: 186 lb 9.6 oz (84.6 kg)   Body mass index is 30.58 kg/m. No exam data present  Patient is in no distress; well developed, nourished and groomed; neck is supple  CARDIOVASCULAR:  Examination of carotid arteries is normal; no carotid bruits  IRREGULAR RATE AND RHYTHM; no murmurs  Examination of peripheral vascular system by observation and palpation is normal  EYES:  Ophthalmoscopic exam of optic discs and posterior segments is normal; no papilledema or hemorrhages  MUSCULOSKELETAL:  Gait, strength, tone, movements noted in Neurologic exam below  NEUROLOGIC: MENTAL STATUS:  No flowsheet data found.  awake, alert, oriented to person, place and time  recent and remote memory intact  normal attention and concentration  language fluent, comprehension intact, naming intact,   fund of knowledge appropriate  CRANIAL NERVE:    2nd, 3rd, 4th, 6th - pupils  equal and reactive to light, visual fields full to confrontation, extraocular muscles intact, no nystagmus  5th - facial sensation symmetric  7th - facial strength symmetric  8th - hearing intact  9th - palate elevates symmetrically, uvula midline  11th - shoulder shrug symmetric  12th - tongue protrusion midline  MOTOR:   normal bulk and tone, full strength in the BUE, BLE  SENSORY:   normal and symmetric to light touch, vibration and temperature  COORDINATION:   finger-nose-finger, fine finger movements normal  REFLEXES:   deep tendon reflexes TRACE and symmetric; ABSENT AT ANKLES  GAIT/STATION:   narrow based gait; ANTALGIC GAIT; LIMPING ON LEFT KNEE     DIAGNOSTIC DATA (LABS, IMAGING, TESTING) - I reviewed patient records, labs, notes, testing and imaging myself where available.  Lab  Results  Component Value Date   WBC 5.7 07/15/2016   HGB 11.5 (L) 07/02/2016   HCT 33.4 (L) 07/15/2016   MCV 98 (H) 07/15/2016   PLT 266 07/15/2016      Component Value Date/Time   NA 138 07/02/2016 1657   K 3.5 07/02/2016 1657   CL 105 07/02/2016 1657   CO2 24 07/02/2016 1657   GLUCOSE 97 07/02/2016 1657   BUN 9 07/02/2016 1657   CREATININE 0.95 07/02/2016 1657   CALCIUM 9.3 07/02/2016 1657   PROT 6.9 10/29/2014 1545   ALBUMIN 4.3 10/29/2014 1545   AST 14 10/29/2014 1545   ALT 8 10/29/2014 1545   ALKPHOS 57 10/29/2014 1545   BILITOT 0.4 10/29/2014 1545   GFRNONAA 58 (L) 07/02/2016 1657   GFRAA >60 07/02/2016 1657   No results found for: CHOL, HDL, LDLCALC, LDLDIRECT, TRIG, CHOLHDL Lab Results  Component Value Date   HGBA1C 5.8 (H) 04/29/2016   Lab Results  Component Value Date   VITAMINB12 1,188 (H) 04/29/2016   Lab Results  Component Value Date   TSH 2.310 07/15/2016    05/22/15 MRI lumbar spine [I reviewed images myself and agree with interpretation. -VRP]  1. At L2-3 there is a mild broad-based disc bulge. 2. At L3-4 there is a mild broad-based disc  bulge flattening the ventral thecal sac. 3. At L4-5 there is a moderate broad-based disc bulge flattening the ventral thecal sac and bilateral foraminal narrowing. 4. At L5-S1 there is mild broad-based disc bulge with mild bilateral facet arthropathy.  08/29/15 MRI left knee - Degenerative maceration of the posterior 1/2 of the body of the medial meniscus. - Tricompartmental osteoarthritis appearing worst in the medial compartment where there is near bone-on-bone joint space narrowing.  - Severe mucoid degeneration of the ACL.    ASSESSMENT AND PLAN  71 y.o. year old female here with pain and cramps in legs. 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 Slightly better on neuropathy cream and OTC motrin. Intolerant of gabapentin.  Ddx: neuropathy vs myopathy/myalgia vs arthritis  No diagnosis found.    PLAN: I spent 25 minutes of face to face time with patient. Greater than 50% of time was spent in counseling and coordination of care with patient. In summary we discussed: - try cymbalta 30mg  daily - offered EMG/NCS; patient declines at this time (due to concern about intolerance of testing) - follow up with orthopedics re: knee degenerative changes - follow up with rheumatology re: arthritis, cold intolerance, diffuse pain evaluation - may need to consider pain mgmt clinic in future - HIGH FALL RISK ON ANTICOAGULATION; advised to use cane or walker (patient will think about it)  Meds ordered this encounter  Medications  . DULoxetine (CYMBALTA) 30 MG capsule    Sig: Take 1 capsule (30 mg total) by mouth daily.    Dispense:  30 capsule    Refill:  6   Return in about 6 months (around 01/17/2017).    Suanne MarkerVIKRAM R. Kaylum Shrum, MD 07/20/2016, 8:07 AM Certified in Neurology, Neurophysiology and Neuroimaging  [facility] [address], [address] [place], [place] [address] [phone]

## 2016-07-20 NOTE — Patient Instructions (Signed)
-   Try duloxetine 30mg  daily (for neuropathy pain).

## 2016-07-22 ENCOUNTER — Ambulatory Visit (HOSPITAL_COMMUNITY)
Admission: RE | Admit: 2016-07-22 | Discharge: 2016-07-22 | Disposition: A | Discharge status: Home / Self Care | Payer: Medicare Other | Source: Ambulatory Visit | Attending: Cardiovascular Disease | Admitting: Cardiovascular Disease

## 2016-07-22 ENCOUNTER — Encounter (HOSPITAL_COMMUNITY): Payer: Medicare Other

## 2016-07-22 ENCOUNTER — Telehealth: Payer: Self-pay | Admitting: Internal Medicine

## 2016-07-22 DIAGNOSIS — I739 Peripheral vascular disease, unspecified: Secondary | ICD-10-CM | POA: Insufficient documentation

## 2016-07-22 NOTE — Telephone Encounter (Signed)
New Message  Pt c/o medication issue:  1. Name of Medication: rivaroxaban (xarelto) 20 mg tabs once daily  2. How are you currently taking this medication (dosage and times per day)? See above  3. Are you having a reaction (difficulty breathing--STAT)? N/A  4. What is your medication issue? Pt wants to know if she can take motrin for pain. Pt wants to make sure there's no interaction due to tylenol not working.

## 2016-07-23 NOTE — Telephone Encounter (Signed)
Spoke with pt. Pt informed she wanted to see if she could take motrin with Xarelto. Informed it is not recommended to take NSAIDS with Xarelto due to increased risk of bleeding. Pt may take Tylenol as directed. Referred pt to call PCP to inquire if there is a different medication they would like to prescribe. Pt verbalized understanding and agreed with plan.

## 2016-08-05 ENCOUNTER — Ambulatory Visit: Payer: Medicare Other | Admitting: Diagnostic Neuroimaging

## 2016-08-06 ENCOUNTER — Telehealth: Payer: Self-pay | Admitting: Internal Medicine

## 2016-08-06 DIAGNOSIS — I739 Peripheral vascular disease, unspecified: Secondary | ICD-10-CM

## 2016-08-06 NOTE — Telephone Encounter (Signed)
Called, spoke with pt. Results from VAS LE U/S: note from Dr. Ladona Ridgelaylor - "She has evidence of some blockage in the blood vessel to her feet. Refer to Dr. Allyson SabalBerry or Kirke CorinArida." Informed Dr. Ladona Ridgelaylor would like to refer pt to see Dr. Allyson SabalBerry or Kirke CorinArida. Will forward referral. Informed pt someone from their office will contact pr to schedule consult. Pt verbalized understanding and agreed with plan.

## 2016-08-06 NOTE — Telephone Encounter (Signed)
Patient is returning your call.    Thanks

## 2016-08-14 ENCOUNTER — Ambulatory Visit: Payer: Medicare Other | Admitting: Physician Assistant

## 2016-08-14 ENCOUNTER — Encounter: Payer: Self-pay | Admitting: Physician Assistant

## 2016-08-14 NOTE — Progress Notes (Deleted)
    Cardiology Office Note   Date:  08/14/2016   ID:  Wallene Dalesettie P Sedberry, DOB [birth year], MRN [id number]  PCP:  Johny BlamerHARRIS, WILLIAM, MD  Cardiologist:  New to [facility] Electrophysiologist: Dr. Tarri Glennaylor  [place], PA-C   No chief complaint on file.   History of Present Illness: Nancy Kline is a 71 y.o. female with a history of peripheral neuropathy, MVP, GERD, atrial flutter s/p ablation on Xarelto  Phone notes refer to claudication symptoms and PAD by TBI index (ABIs nl), appt made  Patient initially arrived for the appointment. However, upon further review of the records, I felt that she would benefit more from seeing Dr. Kirke CorinArida or Dr. Allyson SabalBerry. We were able to get her an appointment with Dr. Kirke CorinArida next week, so felt that would serve her best.   Cancel this appt, see Dr Kirke CorinArida early next week.  Theodore DemarkBarrett, Zak Gondek, Cordelia Poche-C 08/14/2016 10:23 AM Beeper [phone]

## 2016-08-17 NOTE — Progress Notes (Signed)
Cardiology Office Note   Date:  08/18/2016   ID:  Marnette, Perkins 1945/11/20, MRN [id number]  PCP:  Johny Blamer, MD  Cardiologist:  [name]  Chief Complaint  Patient presents with  . Appointment    has 'sensations' in legs. tiredness and fatigue.       History of Present Illness: Nancy Kline is a 71 y.o. female who was referred by [name] for evaluation of possible peripheral arterial disease. She has known history of atrial flutter status post ablation. She was diagnosed recently with atrial fibrillation and started on anticoagulation.  She describes prolonged symptoms of bilateral leg pain with associated tingling, numbness, cold feeling and burning sensation. The symptoms are worse on the right side than the left side. They're mostly noticeable at rest and especially at night. The symptoms improved with walking. She has been diagnosed with peripheral neuropathy and has been seen by neurology and rheumatology for persistent symptoms. She has been tried on multiple medications for neuropathy with poor intolerance. She was referred for vascular studies. ABI was 0.99 bilaterally with mildly reduced left toe pressure. All waveforms were brisk and triphasic.   Past Medical History:  Diagnosis Date  . Atrial fibrillation (HCC)   . Carpal tunnel syndrome   . Chronic back pain   . GERD (gastroesophageal reflux disease)   . Hyperlipidemia   . Mitral valve prolapse   . Overactive bladder   . Palpitations   . Peripheral neuropathy (HCC)   . RLS (restless legs syndrome)   . Sciatica   . Vertigo     Past Surgical History:  Procedure Laterality Date  . ATRIAL FLUTTER ABLATION  04/2011   ablation     Current Outpatient Prescriptions  Medication Sig Dispense Refill  . BIOTIN PO Take 1 tablet by mouth daily.    . calcium acetate (PHOSLO) 667 MG capsule Take 667 mg by mouth 3 (three) times daily with meals.    . cholecalciferol (VITAMIN D) 1000 UNITS tablet Take  1,000 Units by mouth daily.      Marland Kitchen glucosamine-chondroitin 500-400 MG tablet Take 1 tablet by mouth daily.      . meclizine (ANTIVERT) 50 MG tablet Take 50 mg by mouth 3 (three) times daily as needed.    . NONFORMULARY OR COMPOUNDED ITEM APPLY 1 TO 2 GRAMS [2 TO 4 PUMPS] TO THE AFFECTED AREA 3 TO 4 TIMES A DAY (1 PUMP = 0.5 GRAM)  12  . pantoprazole (PROTONIX) 40 MG tablet Take 1 tablet by mouth daily as needed.     . propranolol (INDERAL) 10 MG tablet Take 1 tablet (10 mg total) by mouth 4 (four) times daily as needed (rapid hr). 30 tablet 0  . rivaroxaban (XARELTO) 20 MG TABS tablet Take 1 tablet (20 mg total) by mouth daily with supper. 30 tablet 6  . clonazePAM (KLONOPIN) 0.5 MG tablet Take 0.5 mg by mouth at bedtime.    . DULoxetine (CYMBALTA) 30 MG capsule Take 1 capsule (30 mg total) by mouth daily. (Patient not taking: Reported on 08/18/2016) 30 capsule 6  . TRAVATAN Z 0.004 % SOLN ophthalmic solution Place 1 drop into both eyes at bedtime.      No current facility-administered medications for this visit.     Allergies:   Atorvastatin; Carisoprodol; Celebrex [celecoxib]; Crestor  [rosuvastatin calcium]; Cyclobenzaprine; Darvocet [propoxyphene n-acetaminophen]; Darvon  [propoxyphene]; Etodolac; Flexeril [cyclobenzaprine hcl]; Hydrocodone-acetaminophen; Pregabalin; Pregabalin; and Ropinirole hcl    Social History:  The  patient  reports that she has never smoked. She has never used smokeless tobacco. She reports that she does not drink alcohol or use drugs.   Family History:  The patient's family history includes Dementia in her mother; Hypertension in her mother.    ROS:  Please see the history of present illness.   Otherwise, review of systems are positive for none.   All other systems are reviewed and negative.    PHYSICAL EXAM: VS:  BP 124/68   Pulse 84   Ht 5' 5.5" (1.664 m)   Wt 186 lb (84.4 kg)   SpO2 98%   BMI 30.48 kg/m  , BMI Body mass index is 30.48 kg/m. GEN: Well  nourished, well developed, in no acute distress  HEENT: normal  Neck: no JVD, carotid bruits, or masses Cardiac: RRR; no murmurs, rubs, or gallops,no edema  Respiratory:  clear to auscultation bilaterally, normal work of breathing GI: soft, nontender, nondistended, + BS MS: no deformity or atrophy  Skin: warm and dry, no rash Neuro:  Strength and sensation are intact Psych: euthymic mood, full affect Vascular: Femoral pulses are normal. Distal pulses are palpable on both sides.   EKG:  EKG is not ordered today.    Recent Labs: 07/02/2016: BUN 9; Creatinine, Ser 0.95; Hemoglobin 11.5; Magnesium 2.1; Potassium 3.5; Sodium 138 07/15/2016: Platelets 266; TSH 2.310    Lipid Panel No results found for: CHOL, TRIG, HDL, CHOLHDL, VLDL, LDLCALC, LDLDIRECT    Wt Readings from Last 3 Encounters:  08/18/16 186 lb (84.4 kg)  07/20/16 186 lb 9.6 oz (84.6 kg)  07/15/16 187 lb 9.6 oz (85.1 kg)       No flowsheet data found.    ASSESSMENT AND PLAN:  1.  Peripheral neuropathy: The patient's symptoms are not consistent with claudication. Her pulses are all palpable and vascular studies showed normal ABI and waveforms. The toe pressure was mildly reduced on the left side but she actually reports worsening symptoms on the right side. I do not think she has significant peripheral arterial disease to explain her symptoms. I suggested that she continues follow-up and workup with primary care physician and neurology.  2. Paroxysmal atrial fibrillation: Currently in sinus rhythm. She is on long-term anticoagulation with Xarelto.    Disposition:   FU with me as needed.   Signed,  Lorine BearsMuhammad Jomel Whittlesey, MD  08/18/2016 9:46 AM    [facility]

## 2016-08-18 ENCOUNTER — Ambulatory Visit (INDEPENDENT_AMBULATORY_CARE_PROVIDER_SITE_OTHER): Payer: Medicare Other | Admitting: Cardiovascular Disease

## 2016-08-18 VITALS — BP 124/68 | HR 84 | Ht 65.5 in | Wt 186.0 lb

## 2016-08-18 DIAGNOSIS — G609 Hereditary and idiopathic neuropathy, unspecified: Secondary | ICD-10-CM

## 2016-08-18 DIAGNOSIS — I480 Paroxysmal atrial fibrillation: Secondary | ICD-10-CM

## 2016-08-18 NOTE — Patient Instructions (Signed)

## 2016-08-19 ENCOUNTER — Telehealth: Payer: Self-pay | Admitting: Cardiovascular Disease

## 2016-08-19 NOTE — Telephone Encounter (Signed)
I contacted the pt and made her aware that I had not tried to reach her.  She questioned if Dr Lubertha Basqueaylor's nurse may be trying to call her.  I made her aware that I will forward this note to Renee to contact her. In looking at chart the pt had lab work in January which may be why a phone call was made to her home.

## 2016-08-19 NOTE — Telephone Encounter (Signed)
Pt missed a call she saw on her caller ID, I didn't see a message, pt requested call back from Lauren to see if she called-pls call back

## 2016-08-20 NOTE — Telephone Encounter (Addendum)
Received incoming call from pt. Pt informed she is still having leg pain. Pt had appt with Dr. Kirke CorinArida on 08/18/16   (Dr. Jari SportsmanArida's note from office visit 08/18/16).   1.  Peripheral neuropathy: The patient's symptoms are not consistent with claudication. Her pulses are all palpable and vascular studies showed normal ABI and waveforms. The toe pressure was mildly reduced on the left side but she actually reports worsening symptoms on the right side. I do not think she has significant peripheral arterial disease to explain her symptoms. I suggested that she continues follow-up and workup with primary care physician and neurology.  Pt saw Dr. Tiburcio PeaHarris (PCP) on 08/14/16 for PAD and Neuropathy. Informed pt should contact Dr. Tiburcio PeaHarris  further questions or concerns r/t leg pain. I will forward to Dr. Kirke CorinArida for review. Reminded pt of appt with Dr. Ladona Ridgelaylor on 10/07/16. Informed to call our office if any further concerns. I will be happy to help.

## 2016-08-20 NOTE — Telephone Encounter (Signed)
Called, pt unavailable. Left voice message to call back.  

## 2016-08-21 NOTE — Telephone Encounter (Signed)
Agree 

## 2016-10-07 ENCOUNTER — Encounter: Payer: Self-pay | Admitting: Internal Medicine

## 2016-10-07 ENCOUNTER — Ambulatory Visit (INDEPENDENT_AMBULATORY_CARE_PROVIDER_SITE_OTHER): Payer: Medicare Other | Admitting: Internal Medicine

## 2016-10-07 VITALS — BP 124/62 | HR 86 | Ht 65.2 in | Wt 181.2 lb

## 2016-10-07 DIAGNOSIS — I4892 Unspecified atrial flutter: Secondary | ICD-10-CM | POA: Diagnosis not present

## 2016-10-07 NOTE — Patient Instructions (Signed)
Your physician recommends that you continue on your current medications as directed. Please refer to the Current Medication list given to you today. Your physician wants you to follow-up in: 1 YEAR WITH DR TAYLOR.   You will receive a reminder letter in the mail two months in advance. If you don't receive a letter, please call our office to schedule the follow-up appointment.  

## 2016-10-07 NOTE — Progress Notes (Signed)
HPI Mrs. Butner returns today for followup. She is a very pleasant 71 year old woman with a history of atrial flutter status post catheter ablation.  The patient has been found to have atrial fib. She was placed on Xarelto. She has been diagnosed with peripheral neuropathy. She has occaisional palpitations which have been reasonably controlled with beta blocker therapy. She has tolerated her meds. Allergies  Allergen Reactions  . Atorvastatin Other (See Comments)  . Carisoprodol Other (See Comments)  . Celebrex [Celecoxib] Other (See Comments)    hallucinations  . Crestor  [Rosuvastatin Calcium] Other (See Comments)  . Cyclobenzaprine Other (See Comments)  . Darvocet [Propoxyphene N-Acetaminophen] Other (See Comments)    hallucinations  . Darvon  [Propoxyphene] Other (See Comments)  . Etodolac Other (See Comments)  . Flexeril [Cyclobenzaprine Hcl] Other (See Comments)    hallucinations  . Hydrocodone-Acetaminophen Other (See Comments)  . Pregabalin Other (See Comments)    hallucinations  . Pregabalin Other (See Comments)  . Ropinirole Hcl     Other reaction(s): dizziness/nausea     Current Outpatient Prescriptions  Medication Sig Dispense Refill  . BIOTIN PO Take 1 tablet by mouth daily.    . calcium acetate (PHOSLO) 667 MG capsule Take 667 mg by mouth 3 (three) times daily with meals.    . cholecalciferol (VITAMIN D) 1000 UNITS tablet Take 1,000 Units by mouth daily.      . clonazePAM (KLONOPIN) 0.5 MG tablet Take 0.5 mg by mouth at bedtime.    . DULoxetine (CYMBALTA) 30 MG capsule Take 1 capsule (30 mg total) by mouth daily. 30 capsule 6  . glucosamine-chondroitin 500-400 MG tablet Take 1 tablet by mouth daily.      . meclizine (ANTIVERT) 50 MG tablet Take 50 mg by mouth 3 (three) times daily as needed.    . pantoprazole (PROTONIX) 40 MG tablet Take 1 tablet by mouth daily as needed.     . propranolol (INDERAL) 10 MG tablet Take 1 tablet (10 mg total) by mouth 4 (four) times daily  as needed (rapid hr). 30 tablet 0  . rivaroxaban (XARELTO) 20 MG TABS tablet Take 1 tablet (20 mg total) by mouth daily with supper. 30 tablet 6  . TRAVATAN Z 0.004 % SOLN ophthalmic solution Place 1 drop into both eyes at bedtime.      No current facility-administered medications for this visit.      Past Medical History:  Diagnosis Date  . Atrial fibrillation (HCC)   . Carpal tunnel syndrome   . Chronic back pain   . GERD (gastroesophageal reflux disease)   . Hyperlipidemia   . Mitral valve prolapse   . Overactive bladder   . Palpitations   . Peripheral neuropathy (HCC)   . RLS (restless legs syndrome)   . Sciatica   . Vertigo     ROS:   All systems reviewed and negative except as noted in the HPI.   Past Surgical History:  Procedure Laterality Date  . ATRIAL FLUTTER ABLATION  04/2011   ablation     Family History  Problem Relation Age of Onset  . Hypertension Mother   . Dementia Mother      Social History   Social History  . Marital status: Married    Spouse name: Skarlett Sedlacek Sr.   . Number of children: 2  . Years of education: BS   Occupational History  . Retired Retired   Social History Main Topics  . Smoking status: Never Smoker  . Smokeless  tobacco: Never Used  . Alcohol use No  . Drug use: No  . Sexual activity: Not on file   Other Topics Concern  . Not on file   Social History Narrative   [name]   Caffeine Use: 1 cup of coffee monthly.      BP 124/62   Pulse 86   Ht 5' 5.2" (1.656 m)   Wt 181 lb 3.2 oz (82.2 kg)   SpO2 99%   BMI 29.97 kg/m   Physical Exam:  Well appearing 71 year old woman, NAD HEENT: Unremarkable Neck:  7 cm JVD, no thyromegally Back:  No CVA tenderness Lungs:  Clear with no wheezes, rales, or rhonchi. HEART:  Regular rate rhythm, no murmurs, no rubs, no clicks Abd:  soft, positive bowel sounds, no organomegally, no rebound, no guarding Ext:  2 plus pulses, no edema, no  cyanosis, no clubbing Skin:  No rashes no nodules Neuro:  CN II through XII intact, motor grossly intact  EKG Normal sinus rhythm  Assess/Plan:  1. Atrial fib - she remains in rhythm. She will continue on as needed beta blockers. I would recommend watchful waiting for now. No AA drug therapy at this point. She will continue her current meds. 2. Leg pain -she does not have peripheral vascular disease but does have peripheral neuropathy. She will continue Cymbalta. 3. Fatigue and weakness - etiology remains unclear although my sense is that it is better than it was when she was last in our office.  Leonia ReevesGregg Saleha Kalp,M.D.

## 2016-11-17 ENCOUNTER — Other Ambulatory Visit (HOSPITAL_COMMUNITY): Payer: Self-pay | Admitting: Family Medicine

## 2016-11-17 ENCOUNTER — Ambulatory Visit (HOSPITAL_COMMUNITY)
Admission: RE | Admit: 2016-11-17 | Discharge: 2016-11-17 | Disposition: A | Discharge status: Home / Self Care | Payer: Medicare Other | Source: Ambulatory Visit | Attending: Vascular Surgery | Admitting: Vascular Surgery

## 2016-11-17 DIAGNOSIS — M25561 Pain in right knee: Secondary | ICD-10-CM | POA: Insufficient documentation

## 2016-11-17 DIAGNOSIS — M25461 Effusion, right knee: Secondary | ICD-10-CM | POA: Insufficient documentation

## 2016-11-17 DIAGNOSIS — R52 Pain, unspecified: Secondary | ICD-10-CM

## 2017-01-26 ENCOUNTER — Ambulatory Visit: Payer: Medicare Other | Admitting: Diagnostic Neuroimaging

## 2017-01-27 ENCOUNTER — Encounter: Payer: Self-pay | Admitting: Diagnostic Neuroimaging

## 2017-02-01 ENCOUNTER — Encounter: Payer: Self-pay | Admitting: Podiatry

## 2017-02-01 ENCOUNTER — Ambulatory Visit (INDEPENDENT_AMBULATORY_CARE_PROVIDER_SITE_OTHER): Payer: Medicare Other | Admitting: Podiatry

## 2017-02-01 DIAGNOSIS — M79676 Pain in unspecified toe(s): Secondary | ICD-10-CM

## 2017-02-01 DIAGNOSIS — B351 Tinea unguium: Secondary | ICD-10-CM

## 2017-02-01 NOTE — Progress Notes (Signed)
She presents today regarding the right great toe. States that he gets tender with enclosed shoes concerned about the nails having build up underneath him.  Objective: Vital signs are stable alert and 3 toenails are thick and dystrophic with mycotic.  Assessment: Onychomycosis. Nail dystrophy.  Plan: Debrided dystrophic nails today 1 through 5 bilateral follow-up with me as needed

## 2017-02-18 ENCOUNTER — Other Ambulatory Visit: Payer: Self-pay | Admitting: Internal Medicine

## 2017-02-22 ENCOUNTER — Other Ambulatory Visit: Payer: Self-pay | Admitting: Internal Medicine

## 2017-04-08 ENCOUNTER — Other Ambulatory Visit: Payer: Self-pay | Admitting: Family Medicine

## 2017-04-08 DIAGNOSIS — R109 Unspecified abdominal pain: Secondary | ICD-10-CM

## 2017-04-12 ENCOUNTER — Ambulatory Visit
Admission: RE | Admit: 2017-04-12 | Discharge: 2017-04-12 | Disposition: A | Discharge status: Home / Self Care | Payer: Medicare Other | Source: Ambulatory Visit | Attending: Family Medicine | Admitting: Family Medicine

## 2017-04-12 DIAGNOSIS — R109 Unspecified abdominal pain: Secondary | ICD-10-CM

## 2017-04-12 MED ORDER — IOPAMIDOL (ISOVUE-300) INJECTION 61%
100.0000 mL | Freq: Once | INTRAVENOUS | Status: AC | PRN
  Administered 2017-04-12: 100 mL via INTRAVENOUS

## 2017-04-23 ENCOUNTER — Telehealth: Payer: Self-pay | Admitting: Internal Medicine

## 2017-04-23 NOTE — Telephone Encounter (Signed)
Patient with diagnosis of atrial fibrillation on Xarelto  for anticoagulation.    Procedure: Colonoscopy Date of procedure: 04/28/17  CHADS2-VASc score of 2  (HTN, AGE)  Per office protocol, patient can hold Xarelto for 1-2 days prior to procedure.  Patient will not need bridging with Lovenox (enoxaparin) around procedure.  If not bridging, patient should restart Xarelto on the evening of procedure or day after, at discretion of procedure MD

## 2017-04-23 NOTE — Telephone Encounter (Signed)
°  Nancy Kline     [facility] Pre-operative Risk Assessment    Request for surgical clearance:  1. What type of surgery is being performed? Colonoscopy  2. When is this surgery scheduled? 04/28/17  3. Are there any medications that need to be held prior to surgery and how long? Xarelto. Calling to inquire how long this medication should be held.  4. Practice name and name of physician performing surgery? [facility]. Dr. Lizbeth Bark  5. What is your office phone and fax number? [phone]  6. Anesthesia type (None, local, MAC, general) ? Propofol    Nancy Kline 04/23/2017, 8:31 AM  _________________________________________________________________   (provider comments below)

## 2017-07-15 ENCOUNTER — Other Ambulatory Visit: Payer: Self-pay | Admitting: Diagnostic Neuroimaging

## 2017-09-05 ENCOUNTER — Other Ambulatory Visit: Payer: Self-pay | Admitting: Diagnostic Neuroimaging

## 2017-09-28 ENCOUNTER — Encounter: Payer: Self-pay | Admitting: Internal Medicine

## 2017-09-28 ENCOUNTER — Other Ambulatory Visit: Payer: Self-pay | Admitting: Diagnostic Neuroimaging

## 2017-09-28 ENCOUNTER — Ambulatory Visit: Payer: Medicare Other | Admitting: Internal Medicine

## 2017-09-28 VITALS — BP 128/70 | HR 78 | Ht 65.2 in | Wt 177.6 lb

## 2017-09-28 DIAGNOSIS — I480 Paroxysmal atrial fibrillation: Secondary | ICD-10-CM

## 2017-09-28 DIAGNOSIS — I4892 Unspecified atrial flutter: Secondary | ICD-10-CM | POA: Diagnosis not present

## 2017-09-28 NOTE — Progress Notes (Signed)
HPI Mrs. Edwyna ShellHart returns today for followup. She is a very pleasant 72 year old woman with a history of atrial flutter status post catheter ablation.  The patient has been found to have atrial fib. She was placed on Xarelto. She has been diagnosed with peripheral neuropathy. She has occaisional palpitations which have been reasonably controlled with beta blocker therapy. She has tolerated her meds. She has tried to increase her activity and she has lost weight. Allergies  Allergen Reactions  . Atorvastatin Other (See Comments)  . Carisoprodol Other (See Comments)  . Celebrex [Celecoxib] Other (See Comments)    hallucinations  . Crestor  [Rosuvastatin Calcium] Other (See Comments)  . Cyclobenzaprine Other (See Comments)  . Darvocet [Propoxyphene N-Acetaminophen] Other (See Comments)    hallucinations  . Darvon  [Propoxyphene] Other (See Comments)  . Etodolac Other (See Comments)  . Flexeril [Cyclobenzaprine Hcl] Other (See Comments)    hallucinations  . Hydrocodone-Acetaminophen Other (See Comments)  . Pregabalin Other (See Comments)    hallucinations  . Pregabalin Other (See Comments)  . Ropinirole Hcl     Other reaction(s): dizziness/nausea     Current Outpatient Medications  Medication Sig Dispense Refill  . BIOTIN PO Take 1 tablet by mouth daily.    . calcium acetate (PHOSLO) 667 MG capsule Take 667 mg by mouth 3 (three) times daily with meals.    . cholecalciferol (VITAMIN D) 1000 UNITS tablet Take 1,000 Units by mouth daily.      . meclizine (ANTIVERT) 50 MG tablet Take 50 mg by mouth 3 (three) times daily as needed.    . pantoprazole (PROTONIX) 40 MG tablet Take 1 tablet by mouth daily as needed.     . TRAVATAN Z 0.004 % SOLN ophthalmic solution Place 1 drop into both eyes at bedtime.     Carlena Hurl. XARELTO 20 MG TABS tablet TAKE 1 TABLET (20 MG TOTAL) BY MOUTH DAILY WITH SUPPER. 30 tablet 6  . clonazePAM (KLONOPIN) 0.5 MG tablet Take 0.5 mg by mouth at bedtime.    . DULoxetine  (CYMBALTA) 30 MG capsule TAKE ONE CAPSULE BY MOUTH DAILY (Patient not taking: Reported on 09/28/2017) 30 capsule 0  . glucosamine-chondroitin 500-400 MG tablet Take 1 tablet by mouth daily.      . propranolol (INDERAL) 10 MG tablet Take 1 tablet (10 mg total) by mouth 4 (four) times daily as needed (rapid hr). (Patient not taking: Reported on 09/28/2017) 30 tablet 0   No current facility-administered medications for this visit.      Past Medical History:  Diagnosis Date  . Atrial fibrillation (HCC)   . Carpal tunnel syndrome   . Chronic back pain   . GERD (gastroesophageal reflux disease)   . Hyperlipidemia   . Mitral valve prolapse   . Overactive bladder   . Palpitations   . Peripheral neuropathy   . RLS (restless legs syndrome)   . Sciatica   . Vertigo     ROS:   All systems reviewed and negative except as noted in the HPI.   Past Surgical History:  Procedure Laterality Date  . ATRIAL FLUTTER ABLATION  04/2011   ablation     Family History  Problem Relation Age of Onset  . Hypertension Mother   . Dementia Mother      Social History   Socioeconomic History  . Marital status: Married    Spouse name: Tomasa RandGleenreus Mcarthy Sr.   . Number of children: 2  . Years of education: BS  .  Highest education level: Not on file  Social Needs  . Financial resource strain: Not on file  . Food insecurity - worry: Not on file  . Food insecurity - inability: Not on file  . Transportation needs - medical: Not on file  . Transportation needs - non-medical: Not on file  Occupational History  . Occupation: Retired    Associate Professor: RETIRED  Tobacco Use  . Smoking status: Never Smoker  . Smokeless tobacco: Never Used  Substance and Sexual Activity  . Alcohol use: No  . Drug use: No  . Sexual activity: Not on file  Other Topics Concern  . Not on file  Social History Narrative   [name]   Caffeine Use: 1 cup of coffee monthly.      BP 128/70   Pulse 78    Ht 5' 5.2" (1.656 m)   Wt 177 lb 9.6 oz (80.6 kg)   BMI 29.37 kg/m   Physical Exam:  Well appearing NAD HEENT: Unremarkable Neck:  No JVD, no thyromegally Lymphatics:  No adenopathy Back:  No CVA tenderness Lungs:  Clear HEART:  Regular rate rhythm, no murmurs, no rubs, no clicks Abd:  soft, positive bowel sounds, no organomegally, no rebound, no guarding Ext:  2 plus pulses, no edema, no cyanosis, no clubbing Skin:  No rashes no nodules Neuro:  CN II through XII intact, motor grossly intact  EKG - NSR   Assess/Plan: 1. PAF - she is doing well. She has occaisional palpitations but mostly her symptoms have been relieved. She will continute her Xarelto. 2. HTN - her blood pressure is well controlled. Will follow. 3. Dyslipidemia - she will continue dietary therapy.  Leonia Reeves.D.

## 2017-09-28 NOTE — Patient Instructions (Signed)

## 2017-10-01 ENCOUNTER — Telehealth: Payer: Self-pay | Admitting: Internal Medicine

## 2017-10-01 NOTE — Telephone Encounter (Signed)
Notified Pt that she should not use turmeric while on Xarelto.  Pt indicates understanding.

## 2017-10-01 NOTE — Telephone Encounter (Signed)
Pt c/o medication issue:  1. Name of Medication: .Xarelto   2. How are you currently taking this medication (dosage and times per day)? 20 mg// 1x day   3. Are you having a reaction (difficulty breathing--STAT)? no  4. What is your medication issue? Patient would like to know if it would be okay to take the turmeric supplement (takes for inflammation within her joints) with the Xarelto.

## 2017-10-01 NOTE — Telephone Encounter (Signed)
Pt should take one tablet as needed for heart racing.  No more than 4 tablets in a 24 hour period.  Pt indicates understanding.

## 2017-10-01 NOTE — Telephone Encounter (Signed)
Pt c/o medication issue:  1. Name of Medication: propranolol (INDERAL) 10 MG tablet  2. How are you currently taking this medication (dosage and times per day)?   3. Are you having a reaction (difficulty breathing--STAT)? no  4. What is your medication issue? Patient would like to verify the dosage instructions.

## 2017-11-15 ENCOUNTER — Other Ambulatory Visit: Payer: Self-pay | Admitting: Diagnostic Neuroimaging

## 2018-02-24 ENCOUNTER — Other Ambulatory Visit: Payer: Self-pay | Admitting: Internal Medicine

## 2018-02-24 NOTE — Telephone Encounter (Signed)
SCr 0.89 [facility] at [facility] on 09/10/17. CrCl 71.

## 2018-03-04 ENCOUNTER — Other Ambulatory Visit: Payer: Self-pay | Admitting: Family Medicine

## 2018-03-04 ENCOUNTER — Ambulatory Visit
Admission: RE | Admit: 2018-03-04 | Discharge: 2018-03-04 | Disposition: A | Discharge status: Home / Self Care | Payer: Medicare Other | Source: Ambulatory Visit | Attending: Family Medicine | Admitting: Family Medicine

## 2018-03-04 DIAGNOSIS — R0781 Pleurodynia: Secondary | ICD-10-CM

## 2018-05-12 ENCOUNTER — Telehealth: Payer: Self-pay | Admitting: Internal Medicine

## 2018-05-12 NOTE — Telephone Encounter (Signed)
New message:       Pt is calling and would like to have a visit with Ladona Ridgel asap due to some issues she is having and would like to talk with the RN or possibly come in and see Kelly L.

## 2018-05-12 NOTE — Telephone Encounter (Signed)
Returned call to Pt.  Pt wants to see [name] to discuss what she should take for the neuropathy.  Explained to Pt that [name] did not treat pain or neuropathy.  Pt states she "just wants to talk to him about it".  She is worried about taking pain medication.    Pt insists she needs to be seen because she refuses to take the medication she has been prescribed until she sees [name].  Advised would make next available appt with [name], this would probably be in December.  Pt wants to speak to Ted Mcalpine told her she had availability to see [name] on 06/02/2018.  Per review of 06/02/2018 schedule [name] already has 25 patients scheduled.  Will have Melissa call to schedule follow up appt at appropriate time.

## 2018-05-23 ENCOUNTER — Encounter: Payer: Self-pay | Admitting: Internal Medicine

## 2018-06-13 ENCOUNTER — Encounter

## 2018-06-13 ENCOUNTER — Encounter (INDEPENDENT_AMBULATORY_CARE_PROVIDER_SITE_OTHER): Payer: Self-pay

## 2018-06-13 ENCOUNTER — Encounter: Payer: Self-pay | Admitting: Internal Medicine

## 2018-06-13 ENCOUNTER — Ambulatory Visit: Payer: Medicare Other | Admitting: Internal Medicine

## 2018-06-13 VITALS — BP 130/68 | HR 72 | Ht 65.2 in | Wt 185.2 lb

## 2018-06-13 DIAGNOSIS — I480 Paroxysmal atrial fibrillation: Secondary | ICD-10-CM | POA: Diagnosis not present

## 2018-06-13 DIAGNOSIS — I4892 Unspecified atrial flutter: Secondary | ICD-10-CM

## 2018-06-13 DIAGNOSIS — G609 Hereditary and idiopathic neuropathy, unspecified: Secondary | ICD-10-CM | POA: Diagnosis not present

## 2018-06-13 NOTE — Progress Notes (Signed)
HPI [name] returns today for followup.She is a very pleasant 72 year old woman with a history of atrial flutter status post catheter ablation. The patient has beenfound tohaveatrial fib. Shewas placed on Xarelto. She has been diagnosed with peripheral neuropathy. She has occaisional palpitations which have been reasonably controlled with beta blocker therapy. She has tolerated her meds. She has tried to increase her activity and she has lost weight. Her main complaint however is her peripheral neuropathy. Allergies  Allergen Reactions  . Atorvastatin Other (See Comments)  . Carisoprodol Other (See Comments)  . Celebrex [Celecoxib] Other (See Comments)    hallucinations  . Crestor  [Rosuvastatin Calcium] Other (See Comments)  . Cyclobenzaprine Other (See Comments)  . Darvocet [Propoxyphene N-Acetaminophen] Other (See Comments)    hallucinations  . Darvon  [Propoxyphene] Other (See Comments)  . Etodolac Other (See Comments)  . Flexeril [Cyclobenzaprine Hcl] Other (See Comments)    hallucinations  . Hydrocodone-Acetaminophen Other (See Comments)  . Pregabalin Other (See Comments)    hallucinations  . Pregabalin Other (See Comments)  . Ropinirole Hcl     Other reaction(s): dizziness/nausea     Current Outpatient Medications  Medication Sig Dispense Refill  . BIOTIN PO Take 1 tablet by mouth daily.    . calcium acetate (PHOSLO) 667 MG capsule Take 667 mg by mouth 3 (three) times daily with meals.    . cholecalciferol (VITAMIN D) 1000 UNITS tablet Take 1,000 Units by mouth daily.      Marland Kitchen. gabapentin (NEURONTIN) 100 MG capsule Take 200 mg by mouth at bedtime as needed and may repeat dose one time if needed.    Marland Kitchen. glucosamine-chondroitin 500-400 MG tablet Take 1 tablet by mouth daily.      . meclizine (ANTIVERT) 50 MG tablet Take 50 mg by mouth 3 (three) times daily as needed.    . pantoprazole (PROTONIX) 40 MG tablet Take 1 tablet by mouth daily as needed.     . propranolol  (INDERAL) 10 MG tablet Take 1 tablet (10 mg total) by mouth 4 (four) times daily as needed (rapid hr). 30 tablet 0  . TRAVATAN Z 0.004 % SOLN ophthalmic solution Place 1 drop into both eyes at bedtime.     Nancy Kline. XARELTO 20 MG TABS tablet TAKE 1 TABLET (20 MG TOTAL) BY MOUTH DAILY WITH SUPPER. 30 tablet 6   No current facility-administered medications for this visit.      Past Medical History:  Diagnosis Date  . Atrial fibrillation (HCC)   . Carpal tunnel syndrome   . Chronic back pain   . GERD (gastroesophageal reflux disease)   . Hyperlipidemia   . Mitral valve prolapse   . Overactive bladder   . Palpitations   . Peripheral neuropathy   . RLS (restless legs syndrome)   . Sciatica   . Vertigo     ROS:   All systems reviewed and negative except as noted in the HPI.   Past Surgical History:  Procedure Laterality Date  . ATRIAL FLUTTER ABLATION  04/2011   ablation     Family History  Problem Relation Age of Onset  . Hypertension Mother   . Dementia Mother      Social History   Socioeconomic History  . Marital status: Married    Spouse name: Nancy RandGleenreus Elderkin Sr.   . Number of children: 2  . Years of education: BS  . Highest education level: Not on file  Occupational History  . Occupation: Retired  Employer: RETIRED  Social Needs  . Financial resource strain: Not on file  . Food insecurity:    Worry: Not on file    Inability: Not on file  . Transportation needs:    Medical: Not on file    Non-medical: Not on file  Tobacco Use  . Smoking status: Never Smoker  . Smokeless tobacco: Never Used  Substance and Sexual Activity  . Alcohol use: No  . Drug use: No  . Sexual activity: Not on file  Lifestyle  . Physical activity:    Days per week: Not on file    Minutes per session: Not on file  . Stress: Not on file  Relationships  . Social connections:    Talks on phone: Not on file    Gets together: Not on file    Attends religious service: Not on file     Active member of club or organization: Not on file    Attends meetings of clubs or organizations: Not on file    Relationship status: Not on file  . Intimate partner violence:    Fear of current or ex partner: Not on file    Emotionally abused: Not on file    Physically abused: Not on file    Forced sexual activity: Not on file  Other Topics Concern  . Not on file  Social History Narrative   [name]   Caffeine Use: 1 cup of coffee monthly.      BP 130/68   Pulse 72   Ht 5' 5.2" (1.656 m)   Wt 185 lb 3.2 oz (84 kg)   SpO2 98%   BMI 30.63 kg/m   Physical Exam:  Well appearing NAD HEENT: Unremarkable Neck:  No JVD, no thyromegally Lymphatics:  No adenopathy Back:  No CVA tenderness Lungs:  Clear with no wheezes HEART:  Regular rate rhythm, no murmurs, no rubs, no clicks Abd:  soft, positive bowel sounds, no organomegally, no rebound, no guarding Ext:  2 plus pulses, no edema, no cyanosis, no clubbing Skin:  No rashes no nodules Neuro:  CN II through XII intact, motor grossly intact   Assess/Plan: 1. Peripheral neuropathy - this is her main complaint. The patient had concerns about using alternative treatments and how they might affect her atrial fib and systemic anti-coagulation. I have recommended she see her neurologist. I have encouraged her to seek any alternative therapies as might be endorsed by a neurologist. I specifically think it would be safe to use CBD oil or tumeric. 2. PAF - she appears to be maintaining NSR very nicely. 3. HTN - her blood pressure is controlled. 4. Atrial flutter - she has had none since her ablation.  Leonia Reeves.D.

## 2018-06-13 NOTE — Patient Instructions (Addendum)
Medication Instructions:  Your physician recommends that you continue on your current medications as directed. Please refer to the Current Medication list given to you today.  Labwork: None ordered.  Testing/Procedures: None ordered.  Follow-Up: Your physician wants you to follow-up in: one year with Dr. Ladona Ridgelaylor.   You will receive a reminder letter in the mail two months in advance. If you don't receive a letter, please call our office to schedule the follow-up appointment.  Any Other Special Instructions Will Be Listed Below (If Applicable).  [facility]  If you need a refill on your cardiac medications before your next appointment, please call your pharmacy.

## 2018-08-19 ENCOUNTER — Encounter

## 2018-08-19 ENCOUNTER — Institutional Professional Consult (permissible substitution): Payer: Medicare Other | Admitting: Diagnostic Neuroimaging

## 2018-09-11 ENCOUNTER — Other Ambulatory Visit: Payer: Self-pay | Admitting: Internal Medicine

## 2018-09-11 DIAGNOSIS — I4892 Unspecified atrial flutter: Secondary | ICD-10-CM

## 2018-09-12 NOTE — Telephone Encounter (Signed)
Called and spoke with pt to let her know that the fax [facility] sent was over a yr old and she is compliant to come into the [facility] office to get labs done for the xarelto refill

## 2018-09-12 NOTE — Telephone Encounter (Signed)
Called and talked with pt and they stated that they will have pcp fax results asap

## 2018-09-13 ENCOUNTER — Encounter

## 2018-09-13 ENCOUNTER — Encounter: Payer: Self-pay | Admitting: Diagnostic Neuroimaging

## 2018-09-13 ENCOUNTER — Ambulatory Visit: Payer: Medicare Other | Admitting: Diagnostic Neuroimaging

## 2018-09-13 VITALS — BP 130/63 | HR 78 | Ht 65.2 in | Wt 183.2 lb

## 2018-09-13 DIAGNOSIS — G629 Polyneuropathy, unspecified: Secondary | ICD-10-CM

## 2018-09-13 NOTE — Telephone Encounter (Signed)
Called and spoke to the pt regarding coming to [facility] to complete the labs

## 2018-09-13 NOTE — Patient Instructions (Signed)
NEUROPATHY (idiopathic) - continue gabapentin and duloxetine; try compounded neuropathy creams - follow up with PCP and rheumatology re: arthritis, cold intolerance, diffuse pain evaluation - HIGH FALL RISK ON ANTICOAGULATION; advised to use cane or walker (patient will think about it)

## 2018-09-13 NOTE — Progress Notes (Signed)
[facility]  PATIENT: Nancy Kline DOB: 02-Mar-1946  REFERRING CLINICIAN: Harris HISTORY FROM: patient and daughter  REASON FOR VISIT: follow up   HISTORICAL  CHIEF COMPLAINT:  Chief Complaint  Patient presents with  . Peripheral Neuropathy    rm 7 dgtr - Glee, "worsening neuropathy in hands, feet, legs"    HISTORY OF PRESENT ILLNESS:   UPDATE (09/13/18, VRP): Since last visit, doing worse with arthritis pain in hands and fingers. Also with sharp pain in fingers and toes. Symptoms are progressive. Severity is moderate. No alleviating or aggravating factors.  UPDATE 07/20/16: Since last visit, symptoms continue. Cramps, leg pain, arthritis continue. Has new dx of atrial fibrillation. Also went to rheumatology (Dr. Kellie Simmering) and had right knee injection with prednisone pack (which helped). Also went to PT and had some benefit with treatments.  UPDATE 04/29/16: Since last visit, symptoms continue. Cold intolerance, pain in legs, numbness, stinging, burning pain. Balance is off. Poor sleep.   UPDATE 09/03/15: Since last visit, tried gabapentin and naproxen (1 month) then developed hair loss. Then stopped gabapentin and naproxen. Now on OTC motrin, which is helping more. Neuropathy cream helping a little. Now with left knee pain.   PRIOR HPI (07/02/15): 73 year old left-handed female here for evaluation of lower extremity pain. For past 2 months patient has had intermittent pain, cramps, numbness and tingling in the feet, legs, calves, upper legs and low back. Symptoms are worse in the evening especially when she lays down or sits down. Symptoms are slightly improved she is up and moving around. Cold weather seems to aggravate her symptoms. Patient had MRI lumbar spine which was unremarkable. Patient referred to me for further evaluation. Patient also complaining of long-standing numbness, tingling and pain in her fingers and hands since the 1980s. She thinks she has had  carpal tunnel diagnosis from the past, and has been using response intermittently. She does not want to have nerve conduction studies at this time.   REVIEW OF SYSTEMS: Full 14 system review of systems performed and negative except: joint swelling only for joint pain back pain aching muscles restless legs.    ALLERGIES: Allergies  Allergen Reactions  . Atorvastatin Other (See Comments)  . Carisoprodol Other (See Comments)  . Celebrex [Celecoxib] Other (See Comments)    hallucinations  . Crestor  [Rosuvastatin Calcium] Other (See Comments)  . Cyclobenzaprine Other (See Comments)  . Darvocet [Propoxyphene N-Acetaminophen] Other (See Comments)    hallucinations  . Darvon  [Propoxyphene] Other (See Comments)  . Etodolac Other (See Comments)  . Flexeril [Cyclobenzaprine Hcl] Other (See Comments)    hallucinations  . Hydrocodone-Acetaminophen Other (See Comments)  . Pregabalin Other (See Comments)    hallucinations  . Pregabalin Other (See Comments)  . Ropinirole Hcl     Other reaction(s): dizziness/nausea    HOME MEDICATIONS: Outpatient Medications Prior to Visit  Medication Sig Dispense Refill  . BIOTIN PO Take 1 tablet by mouth daily.    . calcium acetate (PHOSLO) 667 MG capsule Take 667 mg by mouth 3 (three) times daily with meals.    . cholecalciferol (VITAMIN D) 1000 UNITS tablet Take 1,000 Units by mouth daily.      . DULoxetine (CYMBALTA) 20 MG capsule Take 20 mg by mouth daily.    Marland Kitchen gabapentin (NEURONTIN) 100 MG capsule Take 200 mg by mouth at bedtime as needed and may repeat dose one time if needed.    Marland Kitchen glucosamine-chondroitin 500-400 MG tablet Take 1 tablet by mouth  daily.      . latanoprost (XALATAN) 0.005 % ophthalmic solution 1 drop at bedtime.    . meclizine (ANTIVERT) 50 MG tablet Take 50 mg by mouth 3 (three) times daily as needed.    . pantoprazole (PROTONIX) 40 MG tablet Take 1 tablet by mouth daily as needed.     . propranolol (INDERAL) 10 MG tablet Take 1  tablet (10 mg total) by mouth 4 (four) times daily as needed (rapid hr). 30 tablet 0  . XARELTO 20 MG TABS tablet TAKE 1 TABLET (20 MG TOTAL) BY MOUTH DAILY WITH SUPPER. 30 tablet 6  . TRAVATAN Z 0.004 % SOLN ophthalmic solution Place 1 drop into both eyes at bedtime.      No facility-administered medications prior to visit.     PAST MEDICAL HISTORY: Past Medical History:  Diagnosis Date  . Atrial fibrillation (HCC)   . Carpal tunnel syndrome   . Chronic back pain   . GERD (gastroesophageal reflux disease)   . Hyperlipidemia   . Mitral valve prolapse   . Overactive bladder   . Palpitations   . Peripheral neuropathy   . RLS (restless legs syndrome)   . Sciatica   . Vertigo     PAST SURGICAL HISTORY: Past Surgical History:  Procedure Laterality Date  . ATRIAL FLUTTER ABLATION  04/2011   ablation    FAMILY HISTORY: Family History  Problem Relation Age of Onset  . Hypertension Mother   . Dementia Mother   . Breast cancer Sister     SOCIAL HISTORY:  Social History   Socioeconomic History  . Marital status: Married    Spouse name: Tomasa RandGleenreus Whitefield Sr.   . Number of children: 2  . Years of education: BS  . Highest education level: Not on file  Occupational History  . Occupation: Retired    Associate Professormployer: RETIRED  Social Needs  . Financial resource strain: Not on file  . Food insecurity:    Worry: Not on file    Inability: Not on file  . Transportation needs:    Medical: Not on file    Non-medical: Not on file  Tobacco Use  . Smoking status: Never Smoker  . Smokeless tobacco: Never Used  Substance and Sexual Activity  . Alcohol use: No  . Drug use: No  . Sexual activity: Not on file  Lifestyle  . Physical activity:    Days per week: Not on file    Minutes per session: Not on file  . Stress: Not on file  Relationships  . Social connections:    Talks on phone: Not on file    Gets together: Not on file    Attends religious service: Not on file    Active  member of club or organization: Not on file    Attends meetings of clubs or organizations: Not on file    Relationship status: Not on file  . Intimate partner violence:    Fear of current or ex partner: Not on file    Emotionally abused: Not on file    Physically abused: Not on file    Forced sexual activity: Not on file  Other Topics Concern  . Not on file  Social History Narrative   [name]   Caffeine Use: 1 cup of coffee monthly.      PHYSICAL EXAM  GENERAL EXAM/CONSTITUTIONAL: Vitals:  Vitals:   09/13/18 1456  BP: 130/63  Pulse: 78  Weight: 183 lb 3.2  oz (83.1 kg)  Height: 5' 5.2" (1.656 m)   Body mass index is 30.3 kg/m. No exam data present  Patient is in no distress; well developed, nourished and groomed; neck is supple  CARDIOVASCULAR:  Examination of carotid arteries is normal; no carotid bruits  IRREGULAR RATE AND RHYTHM; no murmurs  Examination of peripheral vascular system by observation and palpation is normal  EYES:  Ophthalmoscopic exam of optic discs and posterior segments is normal; no papilledema or hemorrhages  MUSCULOSKELETAL:  Gait, strength, tone, movements noted in Neurologic exam below  NEUROLOGIC: MENTAL STATUS:  No flowsheet data found.  awake, alert, oriented to person, place and time  recent and remote memory intact  normal attention and concentration  language fluent, comprehension intact, naming intact,   fund of knowledge appropriate  CRANIAL NERVE:    2nd, 3rd, 4th, 6th - pupils equal and reactive to light, visual fields full to confrontation, extraocular muscles intact, no nystagmus  5th - facial sensation symmetric  7th - facial strength symmetric  8th - hearing intact  9th - palate elevates symmetrically, uvula midline  11th - shoulder shrug symmetric  12th - tongue protrusion midline  MOTOR:   normal bulk and tone, full strength in the BUE, BLE  SENSORY:   normal and  symmetric to light touch, vibration and temperature  COORDINATION:   finger-nose-finger, fine finger movements normal  REFLEXES:   deep tendon reflexes TRACE and symmetric; ABSENT AT ANKLES  GAIT/STATION:   narrow based gait; ANTALGIC GAIT; LIMPING ON LEFT KNEE     DIAGNOSTIC DATA (LABS, IMAGING, TESTING) - I reviewed patient records, labs, notes, testing and imaging myself where available.  Lab Results  Component Value Date   WBC 5.7 07/15/2016   HGB 10.9 (L) 07/15/2016   HCT 33.4 (L) 07/15/2016   MCV 98 (H) 07/15/2016   PLT 266 07/15/2016      Component Value Date/Time   NA 138 07/02/2016 1657   K 3.5 07/02/2016 1657   CL 105 07/02/2016 1657   CO2 24 07/02/2016 1657   GLUCOSE 97 07/02/2016 1657   BUN 9 07/02/2016 1657   CREATININE 0.95 07/02/2016 1657   CALCIUM 9.3 07/02/2016 1657   PROT 6.9 10/29/2014 1545   ALBUMIN 4.3 10/29/2014 1545   AST 14 10/29/2014 1545   ALT 8 10/29/2014 1545   ALKPHOS 57 10/29/2014 1545   BILITOT 0.4 10/29/2014 1545   GFRNONAA 58 (L) 07/02/2016 1657   GFRAA >60 07/02/2016 1657   No results found for: CHOL, HDL, LDLCALC, LDLDIRECT, TRIG, CHOLHDL Lab Results  Component Value Date   HGBA1C 5.8 (H) 04/29/2016   Lab Results  Component Value Date   VITAMINB12 1,188 (H) 04/29/2016   Lab Results  Component Value Date   TSH 2.310 07/15/2016    05/22/15 MRI lumbar spine [I reviewed images myself and agree with interpretation. -VRP]  1. At L2-3 there is a mild broad-based disc bulge. 2. At L3-4 there is a mild broad-based disc bulge flattening the ventral thecal sac. 3. At L4-5 there is a moderate broad-based disc bulge flattening the ventral thecal sac and bilateral foraminal narrowing. 4. At L5-S1 there is mild broad-based disc bulge with mild bilateral facet arthropathy.  08/29/15 MRI left knee - Degenerative maceration of the posterior 1/2 of the body of the medial meniscus. - Tricompartmental osteoarthritis appearing worst in  the medial compartment where there is near bone-on-bone joint space narrowing.  - Severe mucoid degeneration of the ACL.  ASSESSMENT AND PLAN  73 y.o. year old female here with pain and cramps in legs. 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 Slightly better on neuropathy cream and OTC motrin. Intolerant of gabapentin.  Ddx: neuropathy vs myopathy/myalgia vs arthritis  Neuropathy    PLAN:  NEUROPATHY (idiopathic) - continue gabapentin and duloxetine; try compounded neuropathy creams - consider EMG/NCS; but unlikely to provide benefit; patient declines testing at this time (due to concern about intolerance of testing); also patient would not want to take immunosuppressant meds for inflamm neuropathy even if found - follow up with orthopedics re: knee degenerative changes - follow up with PCP and rheumatology re: arthritis, cold intolerance, diffuse pain evaluation - may need to consider pain mgmt clinic in future - HIGH FALL RISK ON ANTICOAGULATION; advised to use cane or walker (patient will think about it)  Return for return to PCP, pending if symptoms worsen or fail to improve.    Suanne Marker, MD 09/13/2018, 3:08 PM Certified in Neurology, Neurophysiology and Neuroimaging  [facility] [address], [address] [place], [place] [address] [phone]

## 2018-09-13 NOTE — Telephone Encounter (Signed)
ABW crcl 60ml/min Scr 0.89 on 3/1 via [facility] Last OV 06/13/18  Refill for xarelto 20mg  sent to [facility]

## 2018-09-23 LAB — COMPREHENSIVE METABOLIC PANEL
A/G RATIO: 1.7 (ref 1.2–2.2)
ALBUMIN: 4.3 g/dL (ref 3.7–4.7)
ALK PHOS: 65 IU/L (ref 39–117)
ALT: 7 IU/L (ref 0–32)
AST: 16 IU/L (ref 0–40)
BILIRUBIN TOTAL: 0.3 mg/dL (ref 0.0–1.2)
BUN / CREAT RATIO: 16 (ref 12–28)
BUN: 14 mg/dL (ref 8–27)
CO2: 26 mmol/L (ref 20–29)
CREATININE: 0.9 mg/dL (ref 0.57–1.00)
Calcium: 9.6 mg/dL (ref 8.7–10.3)
Chloride: 100 mmol/L (ref 96–106)
GFR calc Af Amer: 73 mL/min/{1.73_m2} (ref >59)
GFR calc non Af Amer: 63 mL/min/{1.73_m2} (ref >59)
GLOBULIN, TOTAL: 2.6 g/dL (ref 1.5–4.5)
Glucose: 103 mg/dL — ABNORMAL HIGH (ref 65–99)
POTASSIUM: 4.4 mmol/L (ref 3.5–5.2)
SODIUM: 140 mmol/L (ref 134–144)
Total Protein: 6.9 g/dL (ref 6.0–8.5)

## 2018-10-08 ENCOUNTER — Telehealth: Payer: Self-pay | Admitting: Cardiology

## 2018-10-08 NOTE — Telephone Encounter (Signed)
Patient called stating that she has felt a rush in her heart.  Her husband was buried yesterday.  She called the pharmacy to get her propranolol refilled and they told her that was not for her heart so she is confused.   Currently she denies any palpitations.  Talked with [facility] and they will refill her propranolol.  She will call us if she has any further problems with palpitations.

## 2019-02-04 ENCOUNTER — Other Ambulatory Visit: Payer: Self-pay | Admitting: Cardiology

## 2019-02-16 IMAGING — CR DG RIBS W/ CHEST 3+V*L*
3 series · 3 of 3 positions shown · non-contrast
Comparison: Chest radiographs 07/02/2016.

CLINICAL DATA: 71-year-old female with left chest wall pain
radiating anteriorly for 10 days after fall.

EXAM:
LEFT RIBS AND CHEST - 3+ VIEW

[w chest pa]
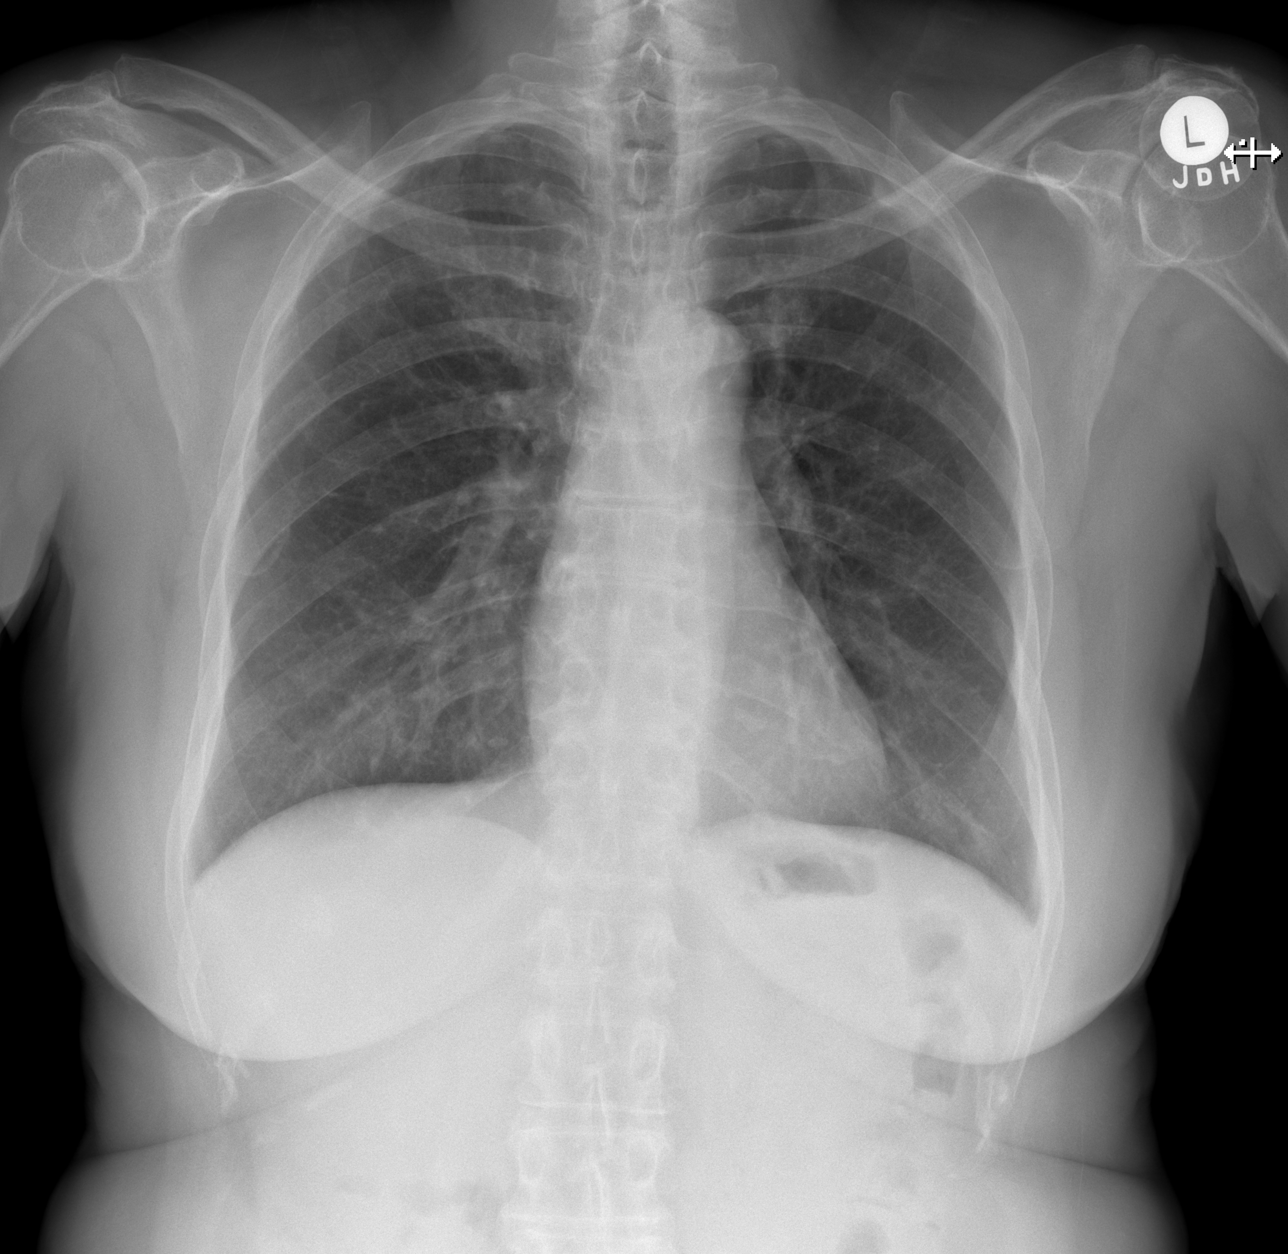

[w ribs ap lower left]
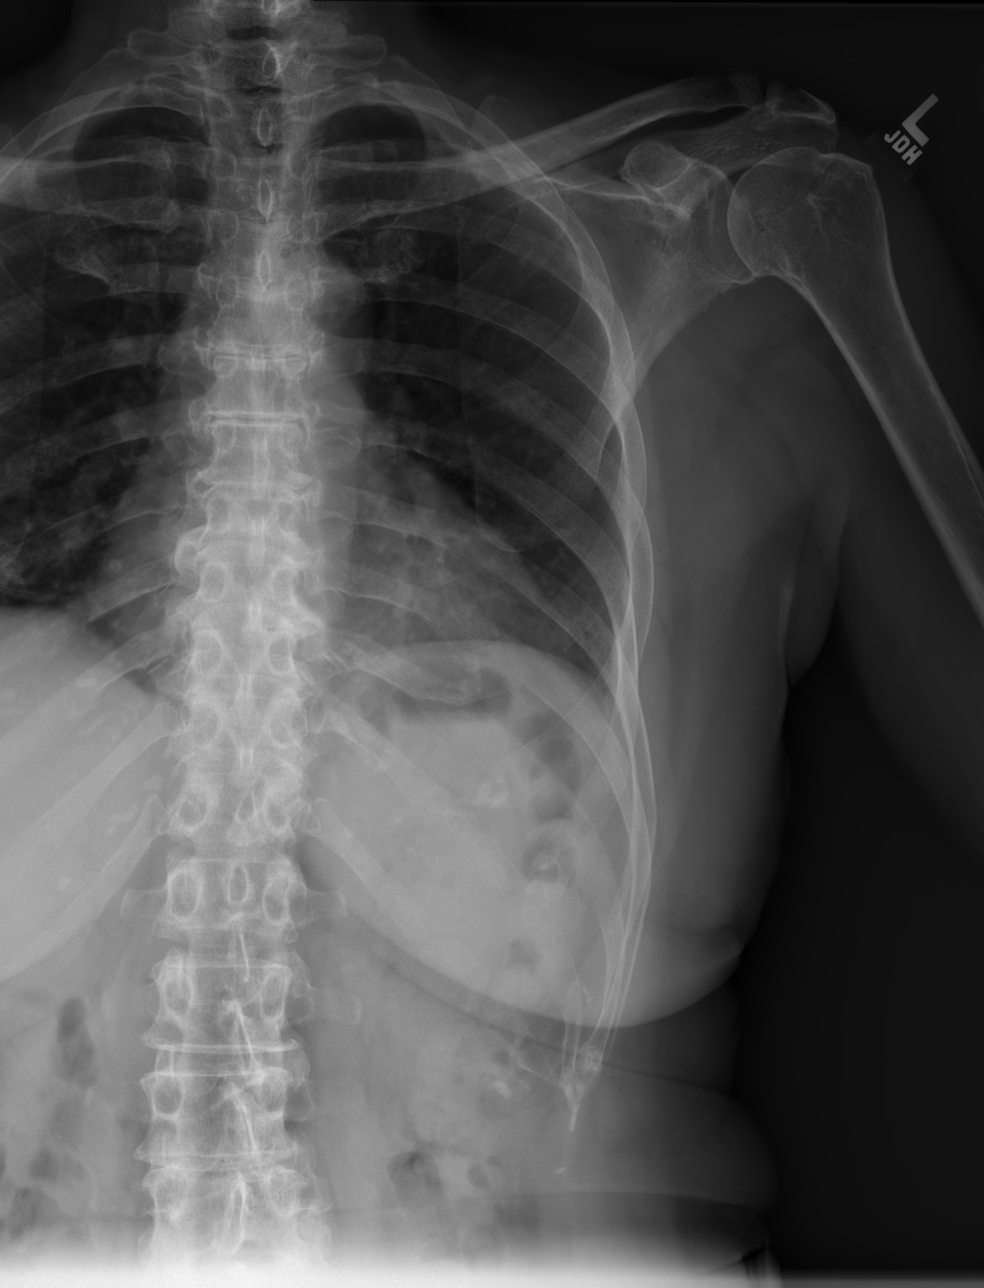

[w ribs obl left]
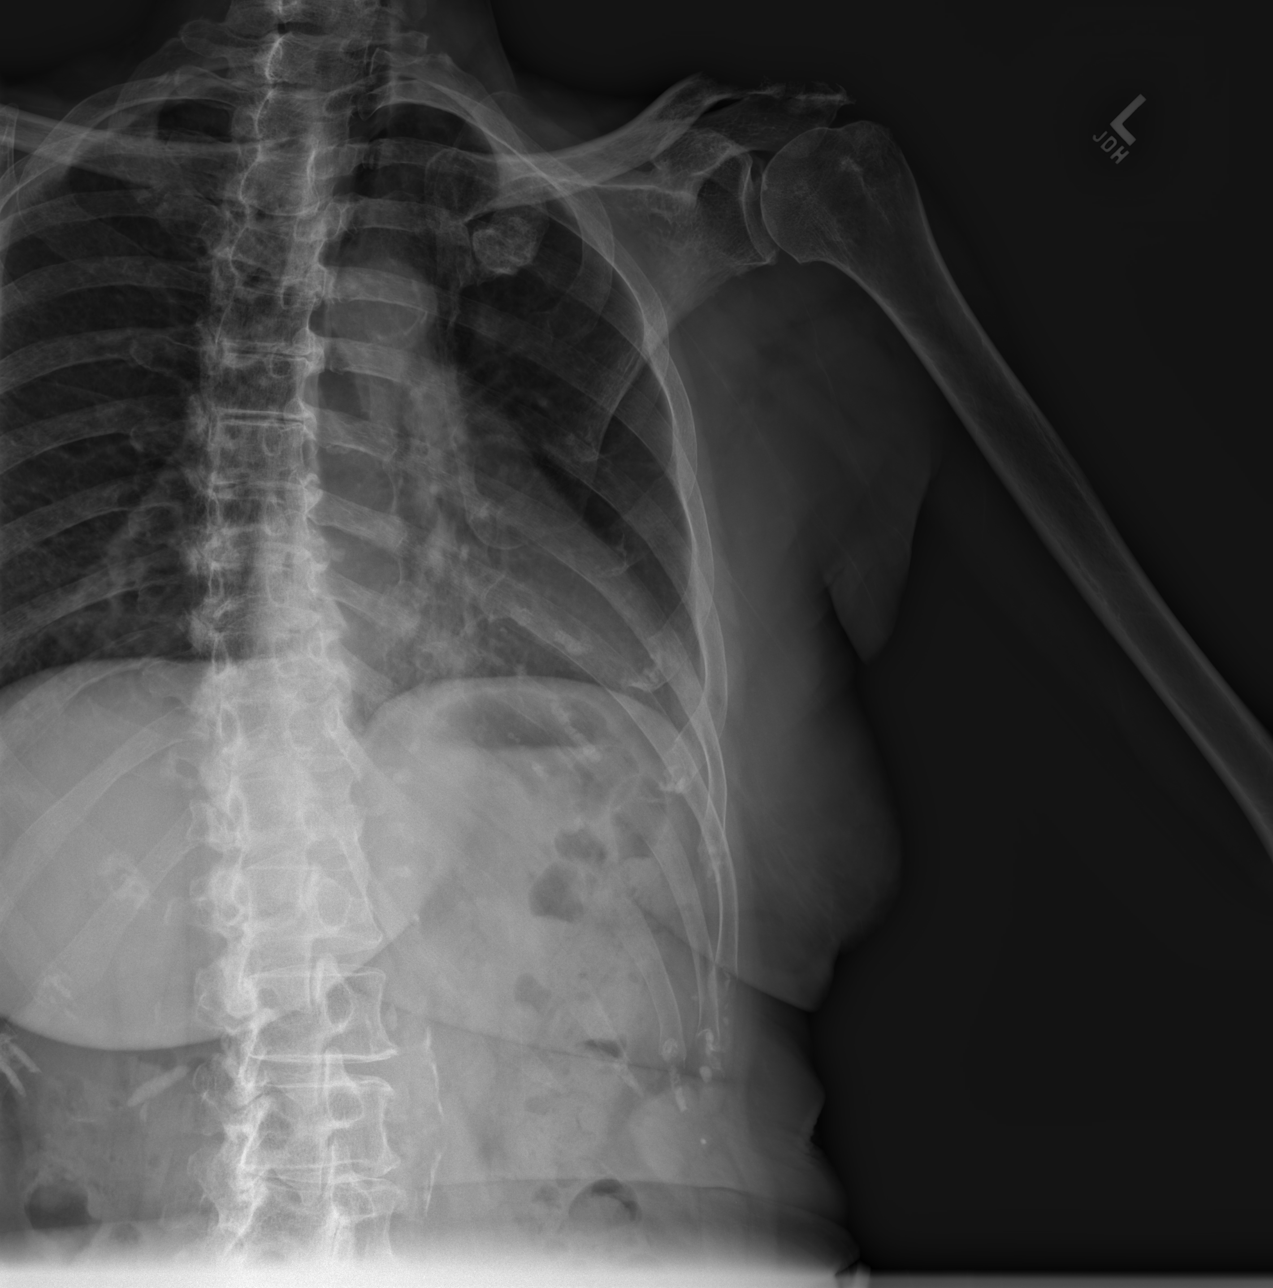

[3 of 3 positions shown; findings below may reference images not displayed]

FINDINGS: Two oblique views of the left ribs. No left rib fracture identified.

Other visible osseous structures appear intact. Bone mineralization
is within normal limits for age.

Lung volumes and mediastinal contours remain normal. Stable mild
increased interstitial markings. No pneumothorax, pulmonary edema or
pleural effusion. Mild patchy opacity at the left lung base
compatible with atelectasis.

Negative visible bowel gas pattern. Calcified aortic
atherosclerosis.
IMPRESSION: 1. Mild left lung base atelectasis suspected but no left rib
fracture identified radiographically.
2.  No other acute cardiopulmonary abnormality.
3.  Aortic Atherosclerosis ([id number]).

## 2019-03-12 ENCOUNTER — Other Ambulatory Visit: Payer: Self-pay | Admitting: Internal Medicine

## 2019-03-13 NOTE — Telephone Encounter (Signed)
58f 83.1kg Scr 0.9 09/22/18 Lovw/taylor 06/13/18

## 2019-06-06 ENCOUNTER — Telehealth: Payer: Self-pay | Admitting: *Deleted

## 2019-06-06 NOTE — Telephone Encounter (Signed)
   [facility] Pre-operative Risk Assessment    Request for surgical clearance:  1. What type of surgery is being performed? CATARACT EXTRACTION w/INTRAOCULAR LENS IMPLANT OF THE LEFT EYE FOLLOWED BY THE RIGHT EYE   2. When is this surgery scheduled? 07/20/19 AND 08/10/19   3. What type of clearance is required (medical clearance vs. Pharmacy clearance to hold med vs. Both)? MEDICAL  4. Are there any medications that need to be held prior to surgery and how long? PER SURGEON NO MEDICATIONS NEED TO BE HELD   5. Practice name and name of physician performing surgery?  [facility]; DR. LISA SUN, FAAO   6. What is your office phone number [phone]    7.   What is your office fax number [phone]  8.   Anesthesia type (None, local, MAC, general) ? TOPICAL WITH IV MEDICATIONS   Julaine Hua 06/06/2019, 8:27 AM  _________________________________________________________________   (provider comments below)

## 2019-06-06 NOTE — Telephone Encounter (Signed)
I called her. As of right now, [facility] doesn't have anything sooner. I offered an appt with APP but she declined. She said if she changed her mind she would call back. :)  Also, checked with Davina Poke told me to offer APP.   Thank you,  [facility]

## 2019-06-06 NOTE — Telephone Encounter (Signed)
I will send a message to Gracy Bruins EP scheduler to reach out to the pt with a sooner appt if possible.

## 2019-06-06 NOTE — Telephone Encounter (Signed)
   Primary Cardiologist: No primary care provider on file.  Electrophysiologist:Gregg Lovena Le, MD   Chart reviewed as part of pre-operative protocol coverage. Cataract extractions are recognized in guidelines as low risk surgeries that do not typically require specific preoperative testing or holding of blood thinner therapy. Therefore, given past medical history and time since last visit, based on ACC/AHA guidelines, Nancy Kline would be at acceptable risk for the planned procedure without further cardiovascular testing.  I did call the patient to check on her current status since we have not seen her since last office visit in 06/2018.  She denies any chest discomfort or shortness of breath.  She does have occasional breakthrough A. fib which responds to propranolol.  She has had to take propranolol a little more often in the last few months, about 3 times per week.  She has been stressed having lost her husband in March.  The patient has a follow-up appointment with Dr. Lovena Le on 1/12.  Although I do not think the patient needs to be seen prior to cataract surgery, the patient would like to be seen earlier just for general reassurance, she says she is on the waiting list.  I will route this to our preop callback pool to see if she can be seen earlier.  I will route this recommendation to the requesting party via Epic fax function and remove from pre-op pool.  Please call with questions.  Daune Perch, NP 06/06/2019, 9:57 AM

## 2019-06-07 NOTE — Telephone Encounter (Signed)
I agree. She does not need to see me before cataract surgery. However, I will see her as scheduled and if she prefers to hold off on cataract surgery, she can wait until January. [facility]

## 2019-06-07 NOTE — Telephone Encounter (Signed)
I will route clearance notes to Dr. Lovena Le for appt . I will remove from the pre op call back pool.

## 2019-06-19 ENCOUNTER — Encounter: Payer: Self-pay | Admitting: Internal Medicine

## 2019-06-19 ENCOUNTER — Other Ambulatory Visit: Payer: Self-pay

## 2019-06-19 ENCOUNTER — Ambulatory Visit: Payer: Medicare Other | Admitting: Internal Medicine

## 2019-06-19 VITALS — BP 128/70 | HR 83 | Ht 66.5 in | Wt 186.2 lb

## 2019-06-19 DIAGNOSIS — I480 Paroxysmal atrial fibrillation: Secondary | ICD-10-CM | POA: Diagnosis not present

## 2019-06-19 DIAGNOSIS — I4892 Unspecified atrial flutter: Secondary | ICD-10-CM | POA: Diagnosis not present

## 2019-06-19 DIAGNOSIS — G609 Hereditary and idiopathic neuropathy, unspecified: Secondary | ICD-10-CM | POA: Diagnosis not present

## 2019-06-19 NOTE — Patient Instructions (Signed)

## 2019-06-19 NOTE — Progress Notes (Signed)
HPI Nancy Kline returns today for followup.She is a very pleasant 73 year old woman with a history of atrial flutter status post catheter ablation. The patient has beenfound tohaveatrial fib. Shewas placed on Xarelto. She has been diagnosed with peripheral neuropathy. She has occaisional palpitations which have been reasonably controlled with beta blocker (propranolol). She has tolerated her meds.She has tried to increase her activity but this has been more difficult with Covid. She lost her husband in March. She has not had syncope. Allergies  Allergen Reactions  . Atorvastatin Other (See Comments)  . Carisoprodol Other (See Comments)  . Celebrex [Celecoxib] Other (See Comments)    hallucinations  . Crestor  [Rosuvastatin Calcium] Other (See Comments)  . Cyclobenzaprine Other (See Comments)  . Darvocet [Propoxyphene N-Acetaminophen] Other (See Comments)    hallucinations  . Darvon  [Propoxyphene] Other (See Comments)  . Etodolac Other (See Comments)  . Flexeril [Cyclobenzaprine Hcl] Other (See Comments)    hallucinations  . Hydrocodone-Acetaminophen Other (See Comments)  . Pregabalin Other (See Comments)    hallucinations  . Pregabalin Other (See Comments)  . Ropinirole Hcl     Other reaction(s): dizziness/nausea     Current Outpatient Medications  Medication Sig Dispense Refill  . BIOTIN PO Take 1 tablet by mouth daily.    . cholecalciferol (VITAMIN D) 1000 UNITS tablet Take 1,000 Units by mouth daily.      . clonazePAM (KLONOPIN) 0.5 MG tablet Take 0.5 tablets by mouth as needed.    . DULoxetine (CYMBALTA) 20 MG capsule Take 20 mg by mouth daily.    Marland Kitchen gabapentin (NEURONTIN) 300 MG capsule Take 300 mg by mouth daily.    Marland Kitchen glucosamine-chondroitin 500-400 MG tablet Take 1 tablet by mouth daily.      Marland Kitchen latanoprost (XALATAN) 0.005 % ophthalmic solution 1 drop at bedtime.    . meclizine (ANTIVERT) 50 MG tablet Take 50 mg by mouth 3 (three) times daily as needed.    .  pantoprazole (PROTONIX) 40 MG tablet Take 1 tablet by mouth daily as needed.     . propranolol (INDERAL) 10 MG tablet TAKE 1 TABLET BY MOUTH 4 TIMES A DAY AS NEEDED FOR HEART PALPITATIONS 60 tablet 2  . XARELTO 20 MG TABS tablet TAKE 1 TABLET (20 MG TOTAL) BY MOUTH DAILY WITH SUPPER. 90 tablet 1   No current facility-administered medications for this visit.      Past Medical History:  Diagnosis Date  . Atrial fibrillation (HCC)   . Carpal tunnel syndrome   . Chronic back pain   . GERD (gastroesophageal reflux disease)   . Hyperlipidemia   . Mitral valve prolapse   . Overactive bladder   . Palpitations   . Peripheral neuropathy   . RLS (restless legs syndrome)   . Sciatica   . Vertigo     ROS:   All systems reviewed and negative except as noted in the HPI.   Past Surgical History:  Procedure Laterality Date  . ATRIAL FLUTTER ABLATION  04/2011   ablation     Family History  Problem Relation Age of Onset  . Hypertension Mother   . Dementia Mother   . Breast cancer Sister      Social History   Socioeconomic History  . Marital status: Married    Spouse name: [name]   . Number of children: 2  . Years of education: BS  . Highest education level: Not on file  Occupational History  . Occupation:  Retired    Fish farm manager: RETIRED  Social Needs  . Financial resource strain: Not on file  . Food insecurity    Worry: Not on file    Inability: Not on file  . Transportation needs    Medical: Not on file    Non-medical: Not on file  Tobacco Use  . Smoking status: Never Smoker  . Smokeless tobacco: Never Used  Substance and Sexual Activity  . Alcohol use: No  . Drug use: No  . Sexual activity: Not on file  Lifestyle  . Physical activity    Days per week: Not on file    Minutes per session: Not on file  . Stress: Not on file  Relationships  . Social Herbalist on phone: Not on file    Gets together: Not on file    Attends religious service:  Not on file    Active member of club or organization: Not on file    Attends meetings of clubs or organizations: Not on file    Relationship status: Not on file  . Intimate partner violence    Fear of current or ex partner: Not on file    Emotionally abused: Not on file    Physically abused: Not on file    Forced sexual activity: Not on file  Other Topics Concern  . Not on file  Social History Narrative   [name]   Caffeine Use: 1 cup of coffee monthly.      BP 128/70   Pulse 83   Ht 5' 6.5" (1.689 m)   Wt 186 lb 3.2 oz (84.5 kg)   SpO2 98%   BMI 29.60 kg/m   Physical Exam:  Well appearing NAD HEENT: Unremarkable Neck:  No JVD, no thyromegally Lymphatics:  No adenopathy Back:  No CVA tenderness Lungs:  Clear with no wheezes HEART:  Regular rate rhythm, no murmurs, no rubs, no clicks Abd:  soft, positive bowel sounds, no organomegally, no rebound, no guarding Ext:  2 plus pulses, no edema, no cyanosis, no clubbing Skin:  No rashes no nodules Neuro:  CN II through XII intact, motor grossly intact  EKG - nsr   Assess/Plan: 1. Atrial fib - her symptoms are well controlled. She will continue her systemic anti-coagulation and prn propranolol. 2. HTN - her bp is well controlled. No change. 3. Obesity - her weight has remained down and I have encouraged her to lose more. 4. Dyspnea - she has sob with exertion. I suspect she has diastolic dysfunction. I asked her to try and walk more and reduce her salt intake.  [name]

## 2019-07-11 ENCOUNTER — Telehealth: Payer: Self-pay | Admitting: Internal Medicine

## 2019-07-11 NOTE — Telephone Encounter (Signed)
Patient calling with questions in regards to the Renewal Program.

## 2019-07-12 NOTE — Telephone Encounter (Signed)
Returned call to pt.  Per Pt she was seeking assistance/information for her insurance program but she has since learned she does not need anything from [place].  No further action needed.

## 2019-07-25 ENCOUNTER — Ambulatory Visit: Payer: Medicare Other | Admitting: Internal Medicine

## 2019-08-01 ENCOUNTER — Ambulatory Visit: Payer: Medicare PPO | Attending: Internal Medicine

## 2019-08-01 DIAGNOSIS — Z23 Encounter for immunization: Secondary | ICD-10-CM

## 2019-08-01 NOTE — Progress Notes (Signed)
   Covid-19 Vaccination Clinic  Name:  Nancy Kline    MRN: [id number] DOB: 1946/06/23  08/01/2019  Nancy Kline was observed post Covid-19 immunization for 15 minutes without incidence. She was provided with Vaccine Information Sheet and instruction to access the V-Safe system.   Nancy Kline was instructed to call 911 with any severe reactions post vaccine: Marland Kitchen Difficulty breathing  . Swelling of your face and throat  . A fast heartbeat  . A bad rash all over your body  . Dizziness and weakness    Immunizations Administered    Name Date Dose VIS Date Route   Pfizer COVID-19 Vaccine 08/01/2019  4:30 PM 0.3 mL 06/23/2019 Intramuscular   Manufacturer: [employer], [employer]   Lot: [id number]   NDC: [address]-5

## 2019-08-19 ENCOUNTER — Ambulatory Visit: Payer: Self-pay

## 2019-08-22 ENCOUNTER — Ambulatory Visit: Payer: Medicare PPO | Attending: Internal Medicine

## 2019-08-22 DIAGNOSIS — Z23 Encounter for immunization: Secondary | ICD-10-CM | POA: Insufficient documentation

## 2019-08-22 NOTE — Progress Notes (Signed)
   Covid-19 Vaccination Clinic  Name:  Nancy Kline    MRN: [id number] DOB: 13-Feb-1946  08/22/2019  [name] was observed post Covid-19 immunization for 15 minutes without incidence. She was provided with Vaccine Information Sheet and instruction to access the V-Safe system.   [name] was instructed to call 911 with any severe reactions post vaccine: Marland Kitchen Difficulty breathing  . Swelling of your face and throat  . A fast heartbeat  . A bad rash all over your body  . Dizziness and weakness    Immunizations Administered    Name Date Dose VIS Date Route   Pfizer COVID-19 Vaccine 08/22/2019 11:52 AM 0.3 mL 06/23/2019 Intramuscular   Manufacturer: [employer], [employer]   Lot: [id number]   NDC: [address]-4

## 2019-08-30 ENCOUNTER — Other Ambulatory Visit: Payer: Self-pay | Admitting: Internal Medicine

## 2019-08-30 ENCOUNTER — Other Ambulatory Visit: Payer: Self-pay | Admitting: Cardiology

## 2019-08-30 NOTE — Telephone Encounter (Signed)
Pt last saw Dr Ladona Ridgel 06/19/19, last labs 09/22/18 Creat 0.90, age 74, weight 84.5, CrCl 72.05, based on CrCl pt is on appropriate dosage of Xarelto 20mg  QD.  Will refill rx.

## 2019-11-09 DIAGNOSIS — H2513 Age-related nuclear cataract, bilateral: Secondary | ICD-10-CM | POA: Diagnosis not present

## 2019-11-09 DIAGNOSIS — H2512 Age-related nuclear cataract, left eye: Secondary | ICD-10-CM | POA: Diagnosis not present

## 2019-11-10 DIAGNOSIS — H25011 Cortical age-related cataract, right eye: Secondary | ICD-10-CM | POA: Diagnosis not present

## 2019-11-10 DIAGNOSIS — H2511 Age-related nuclear cataract, right eye: Secondary | ICD-10-CM | POA: Diagnosis not present

## 2019-11-10 DIAGNOSIS — H25041 Posterior subcapsular polar age-related cataract, right eye: Secondary | ICD-10-CM | POA: Diagnosis not present

## 2019-11-23 DIAGNOSIS — H2513 Age-related nuclear cataract, bilateral: Secondary | ICD-10-CM | POA: Diagnosis not present

## 2019-11-23 DIAGNOSIS — H2511 Age-related nuclear cataract, right eye: Secondary | ICD-10-CM | POA: Diagnosis not present

## 2019-12-27 DIAGNOSIS — F324 Major depressive disorder, single episode, in partial remission: Secondary | ICD-10-CM | POA: Diagnosis not present

## 2019-12-30 ENCOUNTER — Other Ambulatory Visit: Payer: Self-pay | Admitting: Cardiology

## 2020-01-23 DIAGNOSIS — L650 Telogen effluvium: Secondary | ICD-10-CM | POA: Diagnosis not present

## 2020-01-23 DIAGNOSIS — L298 Other pruritus: Secondary | ICD-10-CM | POA: Diagnosis not present

## 2020-01-23 DIAGNOSIS — L218 Other seborrheic dermatitis: Secondary | ICD-10-CM | POA: Diagnosis not present

## 2020-01-23 DIAGNOSIS — L648 Other androgenic alopecia: Secondary | ICD-10-CM | POA: Diagnosis not present

## 2020-03-08 DIAGNOSIS — H26492 Other secondary cataract, left eye: Secondary | ICD-10-CM | POA: Diagnosis not present

## 2020-03-08 DIAGNOSIS — H26493 Other secondary cataract, bilateral: Secondary | ICD-10-CM | POA: Diagnosis not present

## 2020-03-08 DIAGNOSIS — H401131 Primary open-angle glaucoma, bilateral, mild stage: Secondary | ICD-10-CM | POA: Diagnosis not present

## 2020-03-19 DIAGNOSIS — H26492 Other secondary cataract, left eye: Secondary | ICD-10-CM | POA: Diagnosis not present

## 2020-03-20 ENCOUNTER — Encounter: Payer: Self-pay | Admitting: Internal Medicine

## 2020-03-20 ENCOUNTER — Telehealth: Payer: Self-pay | Admitting: Internal Medicine

## 2020-03-20 DIAGNOSIS — I480 Paroxysmal atrial fibrillation: Secondary | ICD-10-CM

## 2020-03-20 NOTE — Telephone Encounter (Signed)
error 

## 2020-03-20 NOTE — Telephone Encounter (Addendum)
Xarelto 20mg  refill request received. Pt is 74 years old, weight-84.5kg, Crea-0.90 on 01/25/2019 via [facility] at [facility], last seen by [facility]. [facility] on 06/19/2019, Diagnosis-Afib, CrCl-72.64ml/min; Dose is appropriate based on dosing criteria. Will send in refill to requested pharmacy.    Called the PCP office and they do not have any recent labs, therefore, will need to reach out to the pt to see if she can come get labs drawn.  At 212pm, called the pt and had to leave a message for the pt to call back; will await.   At 223pm, pt called back and scheduled a lab appt. She has 9 pill left and willing to do a 30 day supply and once labs resulted we will need to send in a 90 day supply. She was very thankful.

## 2020-03-20 NOTE — Telephone Encounter (Signed)
Follow up:     Patient returning your call back concerning medication.

## 2020-03-20 NOTE — Telephone Encounter (Signed)
Pt called pt to change appt. Rescheduled lab appt.

## 2020-03-21 DIAGNOSIS — Z1231 Encounter for screening mammogram for malignant neoplasm of breast: Secondary | ICD-10-CM | POA: Diagnosis not present

## 2020-03-27 ENCOUNTER — Other Ambulatory Visit: Payer: Self-pay

## 2020-03-27 ENCOUNTER — Other Ambulatory Visit: Payer: Medicare PPO

## 2020-03-27 DIAGNOSIS — Z20822 Contact with and (suspected) exposure to covid-19: Secondary | ICD-10-CM

## 2020-03-30 LAB — NOVEL CORONAVIRUS, NAA: SARS-CoV-2, NAA: NOT DETECTED

## 2020-04-03 ENCOUNTER — Other Ambulatory Visit: Payer: Medicare PPO

## 2020-04-03 ENCOUNTER — Other Ambulatory Visit: Payer: Self-pay

## 2020-04-03 DIAGNOSIS — I480 Paroxysmal atrial fibrillation: Secondary | ICD-10-CM

## 2020-04-04 ENCOUNTER — Other Ambulatory Visit: Payer: Self-pay | Admitting: *Deleted

## 2020-04-04 LAB — BASIC METABOLIC PANEL
BUN/Creatinine Ratio: 12 (ref 12–28)
BUN: 12 mg/dL (ref 8–27)
CO2: 27 mmol/L (ref 20–29)
Calcium: 9.3 mg/dL (ref 8.7–10.3)
Chloride: 99 mmol/L (ref 96–106)
Creatinine, Ser: 1.03 mg/dL — ABNORMAL HIGH (ref 0.57–1.00)
GFR calc Af Amer: 61 mL/min/{1.73_m2} (ref >59)
GFR calc non Af Amer: 53 mL/min/{1.73_m2} — ABNORMAL LOW (ref >59)
Glucose: 87 mg/dL (ref 65–99)
Potassium: 4.5 mmol/L (ref 3.5–5.2)
Sodium: 137 mmol/L (ref 134–144)

## 2020-04-04 LAB — CBC
Hematocrit: 32.5 % — ABNORMAL LOW (ref 34.0–46.6)
Hemoglobin: 10.9 g/dL — ABNORMAL LOW (ref 11.1–15.9)
MCH: 33.6 pg — ABNORMAL HIGH (ref 26.6–33.0)
MCHC: 33.5 g/dL (ref 31.5–35.7)
MCV: 100 fL — ABNORMAL HIGH (ref 79–97)
NRBC: 1 % — ABNORMAL HIGH (ref 0–0)
Platelets: 225 10*3/uL (ref 150–450)
RBC: 3.24 x10E6/uL — ABNORMAL LOW (ref 3.77–5.28)
RDW: 13.4 % (ref 11.7–15.4)
WBC: 4.3 10*3/uL (ref 3.4–10.8)

## 2020-04-04 MED ORDER — RIVAROXABAN 20 MG PO TABS: 20.0000 mg | ORAL_TABLET | Freq: Every day | ORAL | 1 refills | Status: DC

## 2020-04-04 NOTE — Telephone Encounter (Signed)
Xarelto 20mg  refill request received. Pt is 74 years old, weight-84.5kg, Crea-1.03 on 04/03/2020, last seen by Dr. 04/05/2020 on 06/19/2019, Diagnosis-Afib, CrCl-62.56ml/min; Dose is appropriate based on dosing criteria. Will send in refill to requested pharmacy.

## 2020-04-05 DIAGNOSIS — H26491 Other secondary cataract, right eye: Secondary | ICD-10-CM | POA: Diagnosis not present

## 2020-04-26 ENCOUNTER — Other Ambulatory Visit: Payer: Self-pay | Admitting: Internal Medicine

## 2020-04-26 DIAGNOSIS — Z23 Encounter for immunization: Secondary | ICD-10-CM | POA: Diagnosis not present

## 2020-04-26 NOTE — Telephone Encounter (Signed)
*  STAT* If patient is at the pharmacy, call can be transferred to refill team.   1. Which medications need to be refilled? (please list name of each medication and dose if known) rivaroxaban (XARELTO) 20 MG TABS tablet [[id number]]    2. Which pharmacy/location (including street and city if local pharmacy) is medication to be sent to?  [id number], [place] - [address] [place] [address]  Phone:  [phone] Fax:  [phone]   3. Do they need a 30 day or 90 day supply? Enough to make it to appt on jan

## 2020-04-26 NOTE — Telephone Encounter (Signed)
Xarelto 20mg  refill was sent on 04/04/2020 for 90 tablets with 1 refill; called the pharmacy to see if the refill was received. Spoke with the pharmacist and he stated that he has the Xarelto refill from 04/04/2020 and he doesn't know why this sent today. He is aware I will deny this refill as he is able to fill for the pt.

## 2020-05-01 ENCOUNTER — Other Ambulatory Visit: Payer: Self-pay | Admitting: Internal Medicine

## 2020-05-08 ENCOUNTER — Other Ambulatory Visit: Payer: Self-pay

## 2020-05-08 ENCOUNTER — Ambulatory Visit (HOSPITAL_COMMUNITY)
Admission: RE | Admit: 2020-05-08 | Discharge: 2020-05-08 | Disposition: A | Discharge status: Home / Self Care | Payer: Medicare PPO | Source: Ambulatory Visit | Attending: Family Medicine | Admitting: Family Medicine

## 2020-05-08 ENCOUNTER — Other Ambulatory Visit (HOSPITAL_COMMUNITY): Payer: Self-pay | Admitting: Family Medicine

## 2020-05-08 DIAGNOSIS — M79605 Pain in left leg: Secondary | ICD-10-CM | POA: Insufficient documentation

## 2020-05-08 DIAGNOSIS — G629 Polyneuropathy, unspecified: Secondary | ICD-10-CM | POA: Diagnosis not present

## 2020-05-13 DIAGNOSIS — M660 Rupture of popliteal cyst: Secondary | ICD-10-CM | POA: Diagnosis not present

## 2020-05-22 DIAGNOSIS — Z6830 Body mass index (BMI) 30.0-30.9, adult: Secondary | ICD-10-CM | POA: Diagnosis not present

## 2020-05-22 DIAGNOSIS — B009 Herpesviral infection, unspecified: Secondary | ICD-10-CM | POA: Diagnosis not present

## 2020-05-22 DIAGNOSIS — Z01411 Encounter for gynecological examination (general) (routine) with abnormal findings: Secondary | ICD-10-CM | POA: Diagnosis not present

## 2020-05-22 DIAGNOSIS — I4891 Unspecified atrial fibrillation: Secondary | ICD-10-CM | POA: Diagnosis not present

## 2020-05-22 DIAGNOSIS — M8580 Other specified disorders of bone density and structure, unspecified site: Secondary | ICD-10-CM | POA: Diagnosis not present

## 2020-05-22 DIAGNOSIS — Z124 Encounter for screening for malignant neoplasm of cervix: Secondary | ICD-10-CM | POA: Diagnosis not present

## 2020-05-22 DIAGNOSIS — M8589 Other specified disorders of bone density and structure, multiple sites: Secondary | ICD-10-CM | POA: Diagnosis not present

## 2020-05-22 DIAGNOSIS — N952 Postmenopausal atrophic vaginitis: Secondary | ICD-10-CM | POA: Diagnosis not present

## 2020-05-23 DIAGNOSIS — Z124 Encounter for screening for malignant neoplasm of cervix: Secondary | ICD-10-CM | POA: Diagnosis not present

## 2020-05-23 DIAGNOSIS — R87610 Atypical squamous cells of undetermined significance on cytologic smear of cervix (ASC-US): Secondary | ICD-10-CM | POA: Diagnosis not present

## 2020-05-24 DIAGNOSIS — M1712 Unilateral primary osteoarthritis, left knee: Secondary | ICD-10-CM | POA: Diagnosis not present

## 2020-05-31 DIAGNOSIS — M5442 Lumbago with sciatica, left side: Secondary | ICD-10-CM | POA: Diagnosis not present

## 2020-06-10 ENCOUNTER — Telehealth: Payer: Self-pay | Admitting: Internal Medicine

## 2020-06-10 NOTE — Telephone Encounter (Signed)
Patient c/o Palpitations:  High priority if patient c/o lightheadedness, shortness of breath, or chest pain  1) How long have you had palpitations/irregular HR/ Afib? Are you having the symptoms now? Patient states over the past few days she has had episodes of palpitations and chest pain  2) Are you currently experiencing lightheadedness, SOB or CP? Not currently, however, patient states she had CP on 06/07/20 and 06/09/20  3) Do you have a history of afib (atrial fibrillation) or irregular heart rhythm? Yes   4) Have you checked your BP or HR? (document readings if available): No  Are you experiencing any other symptoms? Chest pain (only on 06/08/20   Pt c/o of Chest Pain: STAT if CP now or developed within 24 hours  1. Are you having CP right now? No   2. Are you experiencing any other symptoms (ex. SOB, nausea, vomiting, sweating)? No   3. How long have you been experiencing CP? Patient states she experienced CP on 06/07/20 for about 15 minutes and then it reoccurred on 06/09/20 for another 15 minutes   4. Is your CP continuous or coming and going? Coming and going  5. Have you taken Nitroglycerin? No  ?

## 2020-06-10 NOTE — Telephone Encounter (Signed)
Complaining of "heart fluttering" off and on for the last few days.  Pt denies active CP or fluttering but states she had a "slight twinge" that did resolve while having the fluttering one day.  Pt states she is taking medications as prescribed.  Appointment scheduled with Otilio Saber, PA-C for 06/11/2020 at 915am.  Pt advised if she develops active CP, SOB or dizziness to have someone take her to ED for further evaluation.  Pt verbalizes understanding and agrees with current plan.

## 2020-06-10 NOTE — Telephone Encounter (Signed)
Left message for patient to call back  

## 2020-06-11 ENCOUNTER — Other Ambulatory Visit: Payer: Self-pay

## 2020-06-11 ENCOUNTER — Encounter: Payer: Self-pay | Admitting: Student

## 2020-06-11 ENCOUNTER — Ambulatory Visit: Payer: Medicare PPO | Admitting: Student

## 2020-06-11 VITALS — BP 128/68 | HR 86 | Ht 66.0 in | Wt 196.0 lb

## 2020-06-11 DIAGNOSIS — I480 Paroxysmal atrial fibrillation: Secondary | ICD-10-CM | POA: Diagnosis not present

## 2020-06-11 DIAGNOSIS — I4892 Unspecified atrial flutter: Secondary | ICD-10-CM

## 2020-06-11 NOTE — Progress Notes (Signed)
PCP:  Johny Blamer, MD Primary Cardiologist: No primary care provider on file. Electrophysiologist: Lewayne Bunting, MD   Nancy Kline is a 74 y.o. female seen today for Lewayne Bunting, MD for acute visit due to palpitations.  Since last being seen in our clinic the patient reports doing relatively well. Overall she has had very few palpitations. Over the past week she has had 3-4 episodes, before that had been "months". And once it passes she feels immediately better. Each episode has lasted about 15-20 minutes. 3 of the 4 episodes were clustered around Thanksgiving, the other was earlier last week at home alone.  she denies chest pain, palpitations, dyspnea, PND, orthopnea, nausea, vomiting, dizziness, syncope, edema, weight gain, or early satiety.  Past Medical History:  Diagnosis Date   Atrial fibrillation (HCC)    Carpal tunnel syndrome    Chronic back pain    GERD (gastroesophageal reflux disease)    Hyperlipidemia    Mitral valve prolapse    Overactive bladder    Palpitations    Peripheral neuropathy    RLS (restless legs syndrome)    Sciatica    Vertigo    Past Surgical History:  Procedure Laterality Date   ATRIAL FLUTTER ABLATION  04/2011   ablation    Current Outpatient Medications  Medication Sig Dispense Refill   BIOTIN PO Take 1 tablet by mouth daily.     cholecalciferol (VITAMIN D) 1000 UNITS tablet Take 1,000 Units by mouth daily.       clonazePAM (KLONOPIN) 0.5 MG tablet Take 0.5 tablets by mouth as needed.     DULoxetine (CYMBALTA) 20 MG capsule Take 20 mg by mouth daily.     gabapentin (NEURONTIN) 300 MG capsule Take 300 mg by mouth daily.     glucosamine-chondroitin 500-400 MG tablet Take 1 tablet by mouth daily.       latanoprost (XALATAN) 0.005 % ophthalmic solution 1 drop at bedtime.     meclizine (ANTIVERT) 50 MG tablet Take 50 mg by mouth 3 (three) times daily as needed.     pantoprazole (PROTONIX) 40 MG tablet Take 1 tablet by  mouth daily as needed.      predniSONE (DELTASONE) 10 MG tablet daily in the afternoon.     propranolol (INDERAL) 10 MG tablet TAKE 1 TABLET BY MOUTH 4 TIMES A DAY AS NEEDED FOR HEART PALPITATIONS 360 tablet 1   rivaroxaban (XARELTO) 20 MG TABS tablet Take 1 tablet (20 mg total) by mouth daily with supper. 90 tablet 1   traMADol (ULTRAM) 50 MG tablet as needed.     No current facility-administered medications for this visit.    Allergies  Allergen Reactions   Atorvastatin Other (See Comments)   Carisoprodol Other (See Comments)   Celebrex [Celecoxib] Other (See Comments)    hallucinations   Crestor  [Rosuvastatin Calcium] Other (See Comments)   Cyclobenzaprine Other (See Comments)   Darvocet [Propoxyphene N-Acetaminophen] Other (See Comments)    hallucinations   Darvon  [Propoxyphene] Other (See Comments)   Etodolac Other (See Comments)   Flexeril [Cyclobenzaprine Hcl] Other (See Comments)    hallucinations   Hydrocodone-Acetaminophen Other (See Comments)   Pregabalin Other (See Comments)    hallucinations   Pregabalin Other (See Comments)   Ropinirole Hcl     Other reaction(s): dizziness/nausea    Social History   Socioeconomic History   Marital status: Married    Spouse name: Xiana Carns Sr.    Number of children: 2   Years  of education: BS   Highest education level: Not on file  Occupational History   Occupation: Retired    Associate Professor: RETIRED  Tobacco Use   Smoking status: Never Smoker   Smokeless tobacco: Never Used  Building services engineer Use: Never used  Substance and Sexual Activity   Alcohol use: No   Drug use: No   Sexual activity: Not on file  Other Topics Concern   Not on file  Social History Narrative   [name]   Caffeine Use: 1 cup of coffee monthly.    Social Determinants of Health   Financial Resource Strain:    Difficulty of Paying Living Expenses: Not on file  Food Insecurity:     Worried About Programme researcher, broadcasting/film/video in the Last Year: Not on file   [employer] of Food in the Last Year: Not on file  Transportation Needs:    Lack of Transportation (Medical): Not on file   Lack of Transportation (Non-Medical): Not on file  Physical Activity:    Days of Exercise per Week: Not on file   Minutes of Exercise per Session: Not on file  Stress:    Feeling of Stress : Not on file  Social Connections:    Frequency of Communication with Friends and Family: Not on file   Frequency of Social Gatherings with Friends and Family: Not on file   Attends Religious Services: Not on file   Active Member of Clubs or Organizations: Not on file   Attends Banker Meetings: Not on file   Marital Status: Not on file  Intimate Partner Violence:    Fear of Current or Ex-Partner: Not on file   Emotionally Abused: Not on file   Physically Abused: Not on file   Sexually Abused: Not on file    Review of Systems: General: No chills, fever, night sweats or weight changes  Cardiovascular:  No chest pain, dyspnea on exertion, edema, orthopnea, palpitations, paroxysmal nocturnal dyspnea Dermatological: No rash, lesions or masses Respiratory: No cough, dyspnea Urologic: No hematuria, dysuria Abdominal: No nausea, vomiting, diarrhea, bright red blood per rectum, melena, or hematemesis Neurologic: No visual changes, weakness, changes in mental status All other systems reviewed and are otherwise negative except as noted above.  Physical Exam: Vitals:   06/11/20 0945  BP: 128/68  Pulse: 86  SpO2: 99%  Weight: 196 lb (88.9 kg)  Height: 5\' 6"  (1.676 m)    GEN- The patient is well appearing, alert and oriented x 3 today.   HEENT: normocephalic, atraumatic; sclera clear, conjunctiva pink; hearing intact; oropharynx clear; neck supple, no JVP Lymph- no cervical lymphadenopathy Lungs- Clear to ausculation bilaterally, normal work of breathing.  No wheezes, rales,  rhonchi Heart- Regular rate and rhythm, no murmurs, rubs or gallops, PMI not laterally displaced GI- soft, non-tender, non-distended, bowel sounds present, no hepatosplenomegaly Extremities- no clubbing, cyanosis, or edema; DP/PT/radial pulses 2+ bilaterally MS- no significant deformity or atrophy Skin- warm and dry, no rash or lesion Psych- euthymic mood, full affect Neuro- strength and sensation are intact  EKG is ordered. Personal review of EKG from today shows NSR at 86 bpm, QRS at 76 ms, PR interval 138 ms.  Additional studies reviewed include: Previous EP notes  Assessment and Plan:  1. H/o atrial fibrillation/flutter s/p flutter ablation 2012  She has intermittent palpitations, and has had a cluster around thanksgiving. She was stressed over travel and family gatherings, and also still grieving from  the lost of her husband last year, especially around the holidays. We discussed the possibility of transitioning from an as needed BB to daily. She would prefer to hold off for now.  She denies chest pain, syncope, or SOB. Benign palpitations. She was seeking reassurance today and felt better by the end of the visit. Encouraged that these are not life threatening, and to let us know if she has any SOB, lightheadedness, near syncope, syncope, or chest pain.  She denies any missed Xarelto  2. HTN Stable today. No change.   3. Dyspnea Stable today. No change.   Graciella Freer, PA-C  06/11/20 9:46 AM

## 2020-06-11 NOTE — Patient Instructions (Signed)
Medication Instructions:  *If you need a refill on your cardiac medications before your next appointment, please call your pharmacy*  Follow-Up: At [facility], you and your health needs are our priority.  As part of our continuing mission to provide you with exceptional heart care, we have created designated Provider Care Teams.  These Care Teams include your primary Cardiologist (physician) and Advanced Practice Providers (APPs -  Physician Assistants and Nurse Practitioners) who all work together to provide you with the care you need, when you need it.  We recommend signing up for the patient portal called "MyChart".  Sign up information is provided on this After Visit Summary.  MyChart is used to connect with patients for Virtual Visits (Telemedicine).  Patients are able to view lab/test results, encounter notes, upcoming appointments, etc.  Non-urgent messages can be sent to your provider as well.   To learn more about what you can do with MyChart, go to [email or web address].    Your next appointment:   Your physician recommends that you schedule a follow-up appointment in: 6 MONTHS with [name] or APP  The format for your next appointment:   In Person with [name] or you may see one of the following Advanced Practice Providers on your designated Care Team:    Gypsy Balsam, NP  Francis Dowse, PA-C  Casimiro Needle "[facility]" [place], [place]

## 2020-06-17 ENCOUNTER — Encounter: Payer: Self-pay | Admitting: *Deleted

## 2020-06-17 ENCOUNTER — Telehealth: Payer: Self-pay

## 2020-06-17 ENCOUNTER — Ambulatory Visit (INDEPENDENT_AMBULATORY_CARE_PROVIDER_SITE_OTHER): Payer: Medicare PPO

## 2020-06-17 ENCOUNTER — Other Ambulatory Visit: Payer: Self-pay

## 2020-06-17 DIAGNOSIS — R002 Palpitations: Secondary | ICD-10-CM | POA: Diagnosis not present

## 2020-06-17 MED ORDER — PROPRANOLOL HCL 10 MG PO TABS
ORAL_TABLET | ORAL | 3 refills | Status: DC
Start: 2020-06-17 — End: 2021-10-20

## 2020-06-17 NOTE — Telephone Encounter (Signed)
Per Dennis Bast, RN, Clinic Supervisor ok to send call to her.

## 2020-06-17 NOTE — Telephone Encounter (Signed)
Returned call to patient she states she is not feeling great. C/o HA and palpitations on and off since Saturday. She has had several episodes of palpitations the latest being Saturday night. She feels like her body is a restless state.  Her HR is 57 now on Saturday it was 140.  This lasted 20-25 min and felt exhausted after.  On Sunday she rested and still felt sluggish.  HR was normal. She took 2 Propanolol yesterday and still feels sluggish today.  Says HR currently bouncing between 57-64.  She is very anxious. Saw Otilio Saber on 11/30 and was recommended to take Propanolol daily but she declined.  She wants recommendations as to what to do from [name].  I let her know I would forward to his nurse to discuss with him.  She was appreciative of my call.

## 2020-06-17 NOTE — Telephone Encounter (Signed)
Patient requested a call back from K. Darl Pikes.

## 2020-06-17 NOTE — Telephone Encounter (Signed)
Per [name]-- 1.  Take 10 mg propranolol daily.  May take additional tablets as needed.  2.  Wear 14 day zio monitor  3.  Follow up with [name] after monitor.  Outreach made to Pt.  She will come to office today to have monitor placed.  Advised to take propranolol 10 mg daily and then additional as needed.  F/u scheduled in January

## 2020-06-17 NOTE — Telephone Encounter (Signed)
Patient c/o Palpitations:  High priority if patient c/o lightheadedness, shortness of breath, or chest pain  1) How long have you had palpitations/irregular HR/ Afib? Are you having the symptoms now?  Patient states she has experienced 4-5 episodes of palpitations over the past few weeks.  2) Are you currently experiencing lightheadedness, SOB or CP? No   3) Do you have a history of afib (atrial fibrillation) or irregular heart rhythm? Yes   4) Have you checked your BP or HR? (document readings if available):  No log available - she states she has not checked her BP/HR today, but her HR has been in the 60's-70's  5) Are you experiencing any other symptoms? No

## 2020-06-18 DIAGNOSIS — I480 Paroxysmal atrial fibrillation: Secondary | ICD-10-CM | POA: Diagnosis not present

## 2020-06-18 DIAGNOSIS — G609 Hereditary and idiopathic neuropathy, unspecified: Secondary | ICD-10-CM | POA: Diagnosis not present

## 2020-06-18 DIAGNOSIS — E7800 Pure hypercholesterolemia, unspecified: Secondary | ICD-10-CM | POA: Diagnosis not present

## 2020-06-18 DIAGNOSIS — R7303 Prediabetes: Secondary | ICD-10-CM | POA: Diagnosis not present

## 2020-07-03 ENCOUNTER — Telehealth: Payer: Self-pay | Admitting: Internal Medicine

## 2020-07-03 NOTE — Telephone Encounter (Signed)
Returned call to patient. She is going to remove her monitor today and box it up. She will try to drop off at [employer].

## 2020-07-03 NOTE — Telephone Encounter (Signed)
Patient states that she is done wearing her heart monitor, she wants to know what she is supposed to do with it now. She lost the paper that comes with it because she is in the process of moving. Please advise. Patient requesting to speak with Dr. Lubertha Basque nurse.

## 2020-07-05 DIAGNOSIS — N390 Urinary tract infection, site not specified: Secondary | ICD-10-CM | POA: Diagnosis not present

## 2020-07-05 DIAGNOSIS — Z6830 Body mass index (BMI) 30.0-30.9, adult: Secondary | ICD-10-CM | POA: Diagnosis not present

## 2020-07-11 ENCOUNTER — Telehealth: Payer: Self-pay | Admitting: Internal Medicine

## 2020-07-11 DIAGNOSIS — R002 Palpitations: Secondary | ICD-10-CM | POA: Diagnosis not present

## 2020-07-11 NOTE — Telephone Encounter (Signed)
Victorino Dike from [employer] is calling with abnormal zio patch results.

## 2020-07-11 NOTE — Telephone Encounter (Signed)
Received call from [name] from [name] who states patient had the following event during her monitor period:  Rapid a fib 168 bpm lasting 60 seconds on 12/18 at 12:48 pm CT. She states the report has been uploaded and this rhythm can be identified on strip #12. She states their company did not speak with the patient.   I reviewed patient's chart and the reason the monitor was ordered. Pt has hx of PAF and is prescribed Xarelto 20 mg daily. Will forward note to EP team for review. Patient has an appointment on 1/7 with [name].

## 2020-07-13 NOTE — Telephone Encounter (Signed)
No changes

## 2020-07-17 DIAGNOSIS — R351 Nocturia: Secondary | ICD-10-CM | POA: Diagnosis not present

## 2020-07-17 DIAGNOSIS — R319 Hematuria, unspecified: Secondary | ICD-10-CM | POA: Diagnosis not present

## 2020-07-17 DIAGNOSIS — M5459 Other low back pain: Secondary | ICD-10-CM | POA: Diagnosis not present

## 2020-07-17 DIAGNOSIS — M5432 Sciatica, left side: Secondary | ICD-10-CM | POA: Diagnosis not present

## 2020-07-19 ENCOUNTER — Encounter: Payer: Self-pay | Admitting: Internal Medicine

## 2020-07-19 ENCOUNTER — Other Ambulatory Visit: Payer: Self-pay

## 2020-07-19 ENCOUNTER — Ambulatory Visit: Payer: Medicare PPO | Admitting: Internal Medicine

## 2020-07-19 ENCOUNTER — Telehealth: Payer: Self-pay | Admitting: Internal Medicine

## 2020-07-19 VITALS — BP 122/60 | HR 100 | Ht 66.0 in | Wt 189.8 lb

## 2020-07-19 DIAGNOSIS — I480 Paroxysmal atrial fibrillation: Secondary | ICD-10-CM | POA: Diagnosis not present

## 2020-07-19 MED ORDER — FLECAINIDE ACETATE 50 MG PO TABS: 50.0000 mg | ORAL_TABLET | Freq: Two times a day (BID) | ORAL | 11 refills | Status: DC

## 2020-07-19 NOTE — Telephone Encounter (Signed)
[name] and PharmD notified that Pt is on Welbutrin XL 150 mg daily.  (not reported at office visit today)  Per review by PharmD-ok for Pt to initiate low dose flecainide with close follow up.  [name] in agreement.  [facility]  Left detailed message for Pt advising of above.  Await further needs.

## 2020-07-19 NOTE — Telephone Encounter (Signed)
Pt c/o medication issue:  1. Name of Medication: flecainide (TAMBOCOR) 50 MG tablet Bupropion XL 150 MG's   2. How are you currently taking this medication (dosage and times per day)? Takes Bupropion XL 150 MG's once daily. Not currently taking Flecainide.  3. Are you having a reaction (difficulty breathing--STAT)? No  4. What is your medication issue? [employer] is calling stating she received a message from her pharmacy that they are trying to contact our office for confirmation in regards to Flecainide. I called [facility]'s pharmacy and they stated there is a possible reaction conflict between these two medications. I advised the pharmacy we did not have Bupropion listed on her current medication list and would send a medication with confirmation from [name] in regards to it to then have our office reach back out. Please advise.

## 2020-07-19 NOTE — Patient Instructions (Addendum)
Medication Instructions:  Your physician has recommended you make the following change in your medication:   1.  START taking flecainide 50 mg- Take one tablet by mouth twice a day  2.  CONTINUE taking propranolol 10 mg   Labwork: None ordered.  Testing/Procedures: None ordered.  Follow-Up:  You will follow up with a nurse visit in 2 weeks for an EKG-- August 02, 2020 at 2:00 pm  Your physician wants you to follow-up in: 6-8 weeks with Lewayne Bunting, MD at the [facility] office-- September 12, 2020 at 3:45 pm   Any Other Special Instructions Will Be Listed Below (If Applicable).  If you need a refill on your cardiac medications before your next appointment, please call your pharmacy.   Flecainide tablets What is this medicine? FLECAINIDE (FLEK a nide) is an antiarrhythmic drug. This medicine is used to prevent irregular heart rhythm. It can also slow down fast heartbeats called tachycardia. This medicine may be used for other purposes; ask your health care provider or pharmacist if you have questions. COMMON BRAND NAME(S): Tambocor What should I tell my health care provider before I take this medicine? They need to know if you have any of these conditions:  abnormal levels of potassium in the blood  heart disease including heart rhythm and heart rate problems  kidney or liver disease  recent heart attack  an unusual or allergic reaction to flecainide, local anesthetics, other medicines, foods, dyes, or preservatives  pregnant or trying to get pregnant  breast-feeding How should I use this medicine? Take this medicine by mouth with a glass of water. Follow the directions on the prescription label. You can take this medicine with or without food. Take your doses at regular intervals. Do not take your medicine more often than directed. Do not stop taking this medicine suddenly. This may cause serious, heart-related side effects. If your doctor wants you to stop the medicine,  the dose may be slowly lowered over time to avoid any side effects. Talk to your pediatrician regarding the use of this medicine in children. While this drug may be prescribed for children as young as 1 year of age for selected conditions, precautions do apply. Overdosage: If you think you have taken too much of this medicine contact a poison control center or emergency room at once. NOTE: This medicine is only for you. Do not share this medicine with others. What if I miss a dose? If you miss a dose, take it as soon as you can. If it is almost time for your next dose, take only that dose. Do not take double or extra doses. What may interact with this medicine? Do not take this medicine with any of the following medications:  amoxapine  arsenic trioxide  certain antibiotics like clarithromycin, erythromycin, gatifloxacin, gemifloxacin, levofloxacin, moxifloxacin, sparfloxacin, or troleandomycin  certain antidepressants called tricyclic antidepressants like amitriptyline, imipramine, or nortriptyline  certain medicines to control heart rhythm like disopyramide, encainide, moricizine, procainamide, propafenone, and quinidine  cisapride  delavirdine  droperidol  haloperidol  hawthorn  imatinib  levomethadyl  maprotiline  medicines for malaria like chloroquine and halofantrine  pentamidine  phenothiazines like chlorpromazine, mesoridazine, prochlorperazine, thioridazine  pimozide  quinine  ranolazine  ritonavir  sertindole This medicine may also interact with the following medications:  cimetidine  dofetilide  medicines for angina or high blood pressure  medicines to control heart rhythm like amiodarone and digoxin  ziprasidone This list may not describe all possible interactions. Give your health care  provider a list of all the medicines, herbs, non-prescription drugs, or dietary supplements you use. Also tell them if you smoke, drink alcohol, or use illegal  drugs. Some items may interact with your medicine. What should I watch for while using this medicine? Visit your doctor or health care professional for regular checks on your progress. Because your condition and the use of this medicine carries some risk, it is a good idea to carry an identification card, necklace or bracelet with details of your condition, medications and doctor or health care professional. Check your blood pressure and pulse rate regularly. Ask your health care professional what your blood pressure and pulse rate should be, and when you should contact him or her. Your doctor or health care professional also may schedule regular blood tests and electrocardiograms to check your progress. You may get drowsy or dizzy. Do not drive, use machinery, or do anything that needs mental alertness until you know how this medicine affects you. Do not stand or sit up quickly, especially if you are an older patient. This reduces the risk of dizzy or fainting spells. Alcohol can make you more dizzy, increase flushing and rapid heartbeats. Avoid alcoholic drinks. What side effects may I notice from receiving this medicine? Side effects that you should report to your doctor or health care professional as soon as possible:  chest pain, continued irregular heartbeats  difficulty breathing  swelling of the legs or feet  trembling, shaking  unusually weak or tired Side effects that usually do not require medical attention (report to your doctor or health care professional if they continue or are bothersome):  blurred vision  constipation  headache  nausea, vomiting  stomach pain This list may not describe all possible side effects. Call your doctor for medical advice about side effects. You may report side effects to FDA at 1-800-FDA-1088. Where should I keep my medicine? Keep out of the reach of children. Store at room temperature between 15 and 30 degrees C (59 and 86 degrees F). Protect  from light. Keep container tightly closed. Throw away any unused medicine after the expiration date. NOTE: This sheet is a summary. It may not cover all possible information. If you have questions about this medicine, talk to your doctor, pharmacist, or health care provider.  2020 Elsevier/Gold Standard (2018-06-20 11:41:38)

## 2020-07-19 NOTE — Progress Notes (Signed)
HPI [name] returns today for followup of palpitations. She is a pleasant 75 yo woman with PAF, HTN, dyslipidemia and chronic systemic anti-coagulation. She has had palpitations and wore a cardiac monitor demonstrating atrial fib with a RVR.  Allergies  Allergen Reactions  . Atorvastatin Other (See Comments)  . Carisoprodol Other (See Comments)  . Celebrex [Celecoxib] Other (See Comments)    hallucinations  . Crestor  [Rosuvastatin Calcium] Other (See Comments)  . Cyclobenzaprine Other (See Comments)  . Darvocet [Propoxyphene N-Acetaminophen] Other (See Comments)    hallucinations  . Darvon  [Propoxyphene] Other (See Comments)  . Etodolac Other (See Comments)  . Flexeril [Cyclobenzaprine Hcl] Other (See Comments)    hallucinations  . Gabapentin Other (See Comments)  . Hydrocodone-Acetaminophen Other (See Comments)  . Pramipexole Dihydrochloride Other (See Comments)  . Pregabalin Other (See Comments)    hallucinations  . Pregabalin Other (See Comments)  . Ropinirole Hcl     Other reaction(s): dizziness/nausea     Current Outpatient Medications  Medication Sig Dispense Refill  . BIOTIN PO Take 1 tablet by mouth daily.    . cholecalciferol (VITAMIN D) 1000 UNITS tablet Take 1,000 Units by mouth daily.    . clonazePAM (KLONOPIN) 0.5 MG tablet Take 0.5 tablets by mouth as needed.    . DULoxetine (CYMBALTA) 20 MG capsule Take 20 mg by mouth daily.    Marland Kitchen gabapentin (NEURONTIN) 300 MG capsule Take 300 mg by mouth daily.    Marland Kitchen glucosamine-chondroitin 500-400 MG tablet Take 1 tablet by mouth daily.    Marland Kitchen latanoprost (XALATAN) 0.005 % ophthalmic solution 1 drop at bedtime.    . meclizine (ANTIVERT) 50 MG tablet Take 50 mg by mouth 3 (three) times daily as needed.    . pantoprazole (PROTONIX) 40 MG tablet Take 1 tablet by mouth daily as needed.     . predniSONE (DELTASONE) 10 MG tablet daily in the afternoon.    . propranolol (INDERAL) 10 MG tablet Take one tablet by mouth daily.   May take up to an additional 3 tablets by mouth daily for breakthrough palpitations. 90 tablet 3  . rivaroxaban (XARELTO) 20 MG TABS tablet Take 1 tablet (20 mg total) by mouth daily with supper. 90 tablet 1  . traMADol (ULTRAM) 50 MG tablet as needed.     No current facility-administered medications for this visit.     Past Medical History:  Diagnosis Date  . Atrial fibrillation (HCC)   . Carpal tunnel syndrome   . Chronic back pain   . GERD (gastroesophageal reflux disease)   . Hyperlipidemia   . Mitral valve prolapse   . Overactive bladder   . Palpitations   . Peripheral neuropathy   . RLS (restless legs syndrome)   . Sciatica   . Vertigo     ROS:   All systems reviewed and negative except as noted in the HPI.   Past Surgical History:  Procedure Laterality Date  . ATRIAL FLUTTER ABLATION  04/2011   ablation     Family History  Problem Relation Age of Onset  . Hypertension Mother   . Dementia Mother   . Breast cancer Sister      Social History   Socioeconomic History  . Marital status: Married    Spouse name: Reneka Nebergall Sr.   . Number of children: 2  . Years of education: BS  . Highest education level: Not on file  Occupational History  . Occupation: Retired  Employer: RETIRED  Tobacco Use  . Smoking status: Never Smoker  . Smokeless tobacco: Never Used  Vaping Use  . Vaping Use: Never used  Substance and Sexual Activity  . Alcohol use: No  . Drug use: No  . Sexual activity: Not on file  Other Topics Concern  . Not on file  Social History Narrative   [name]   Caffeine Use: 1 cup of coffee monthly.    Social Determinants of Health   Financial Resource Strain: Not on file  Food Insecurity: Not on file  Transportation Needs: Not on file  Physical Activity: Not on file  Stress: Not on file  Social Connections: Not on file  Intimate Partner Violence: Not on file     Ht 5\' 6"  (1.676 m)   BMI 31.64 kg/m    Physical Exam:  Well appearing NAD HEENT: Unremarkable Neck:  No JVD, no thyromegally Lymphatics:  No adenopathy Back:  No CVA tenderness Lungs:  Clear with no wheezes HEART:  Regular rate rhythm, no murmurs, no rubs, no clicks Abd:  soft, positive bowel sounds, no organomegally, no rebound, no guarding Ext:  2 plus pulses, no edema, no cyanosis, no clubbing Skin:  No rashes no nodules Neuro:  CN II through XII intact, motor grossly intact    Cardiac monitor - reviewed  Assess/Plan: 1. Atrial fib - I have discussed the options for treatment with the patient and her daughter. They have many questions. After reviewing the multiple possible treatment options she is willing to try flecainide. We will see how she tolerates this.AV node ablation and atrial fib ablation and other AA drugs were also discussed. 2. HTN - her bp is controlled for now. 3. Coags - she has not had any bleeding on xarelto.  I spent 45 minutes including 50% face to face time with the patient and her daughter formulating a plan.  Henny Strauch,MD

## 2020-07-25 DIAGNOSIS — M25562 Pain in left knee: Secondary | ICD-10-CM | POA: Diagnosis not present

## 2020-07-25 DIAGNOSIS — M545 Low back pain, unspecified: Secondary | ICD-10-CM | POA: Diagnosis not present

## 2020-07-26 DIAGNOSIS — D72829 Elevated white blood cell count, unspecified: Secondary | ICD-10-CM | POA: Diagnosis not present

## 2020-07-26 DIAGNOSIS — R319 Hematuria, unspecified: Secondary | ICD-10-CM | POA: Diagnosis not present

## 2020-08-02 ENCOUNTER — Ambulatory Visit: Payer: Medicare PPO

## 2020-08-08 ENCOUNTER — Other Ambulatory Visit: Payer: Self-pay

## 2020-08-08 ENCOUNTER — Ambulatory Visit (INDEPENDENT_AMBULATORY_CARE_PROVIDER_SITE_OTHER): Payer: Medicare PPO

## 2020-08-08 VITALS — BP 132/60 | HR 64 | Resp 15 | Ht 66.0 in | Wt 193.4 lb

## 2020-08-08 DIAGNOSIS — I480 Paroxysmal atrial fibrillation: Secondary | ICD-10-CM

## 2020-08-08 NOTE — Patient Instructions (Addendum)
Medication Instructions:  Your physician recommends that you continue on your current medications as directed. Please refer to the Current Medication list given to you today.  Please try and continue your Flecainide for a few more weeks. Symptoms will likely subside, but if they continue to bother you please let [name] know.  Labwork: None ordered.  Testing/Procedures: None ordered.  Follow-Up: Your physician recommends that you schedule a follow-up appointment in:   March 3 @ 3:45pm with [name]  Any Other Special Instructions Will Be Listed Below (If Applicable).     If you need a refill on your cardiac medications before your next appointment, please call your pharmacy.

## 2020-08-08 NOTE — Progress Notes (Signed)
Pt presents today for a follow up on a new start flecainide EKG. Pt states she has had hair loss, GI upset, and overall "does not feel well" on this medication. Otherwise she has not noticed any other symptoms.  Per DOD Dr. Eden Emms, EKG is WNL for resting HR. He suggests pt participate in a GXT to increase HR. He encourages pt to continue flecainide for a few more weeks to see if symptoms lessen.  Will forward encounter to [name] and his RN for additional review.

## 2020-08-09 NOTE — Addendum Note (Signed)
Addended by: Daleen Bo I on: 08/09/2020 03:34 PM   Modules accepted: Orders

## 2020-08-14 DIAGNOSIS — M5416 Radiculopathy, lumbar region: Secondary | ICD-10-CM | POA: Diagnosis not present

## 2020-08-15 DIAGNOSIS — R351 Nocturia: Secondary | ICD-10-CM | POA: Diagnosis not present

## 2020-08-15 DIAGNOSIS — M545 Low back pain, unspecified: Secondary | ICD-10-CM | POA: Diagnosis not present

## 2020-08-15 DIAGNOSIS — R8279 Other abnormal findings on microbiological examination of urine: Secondary | ICD-10-CM | POA: Diagnosis not present

## 2020-08-16 DIAGNOSIS — M5416 Radiculopathy, lumbar region: Secondary | ICD-10-CM | POA: Diagnosis not present

## 2020-08-19 DIAGNOSIS — M25562 Pain in left knee: Secondary | ICD-10-CM | POA: Diagnosis not present

## 2020-08-28 DIAGNOSIS — M5416 Radiculopathy, lumbar region: Secondary | ICD-10-CM | POA: Diagnosis not present

## 2020-08-29 DIAGNOSIS — M25562 Pain in left knee: Secondary | ICD-10-CM | POA: Diagnosis not present

## 2020-08-30 DIAGNOSIS — M5416 Radiculopathy, lumbar region: Secondary | ICD-10-CM | POA: Diagnosis not present

## 2020-09-04 DIAGNOSIS — M5416 Radiculopathy, lumbar region: Secondary | ICD-10-CM | POA: Diagnosis not present

## 2020-09-11 DIAGNOSIS — M5416 Radiculopathy, lumbar region: Secondary | ICD-10-CM | POA: Diagnosis not present

## 2020-09-12 ENCOUNTER — Other Ambulatory Visit: Payer: Self-pay

## 2020-09-12 ENCOUNTER — Ambulatory Visit: Payer: Medicare PPO | Admitting: Internal Medicine

## 2020-09-12 ENCOUNTER — Encounter: Payer: Self-pay | Admitting: Internal Medicine

## 2020-09-12 VITALS — BP 152/74 | HR 73 | Ht 66.0 in | Wt 190.6 lb

## 2020-09-12 DIAGNOSIS — I480 Paroxysmal atrial fibrillation: Secondary | ICD-10-CM

## 2020-09-12 NOTE — Patient Instructions (Addendum)
Medication Instructions:  °Your physician recommends that you continue on your current medications as directed. Please refer to the Current Medication list given to you today. ° °Labwork: °None ordered. ° °Testing/Procedures: °None ordered. ° °Follow-Up: °Your physician wants you to follow-up in: 6 months with Gregg Taylor, MD or one of the following Advanced Practice Providers on your designated Care Team:   °· Amber Seiler, NP °· Renee Ursuy, PA-C °· Michael "Andy" Tillery, PA-C ° ° °Any Other Special Instructions Will Be Listed Below (If Applicable). ° °If you need a refill on your cardiac medications before your next appointment, please call your pharmacy.  ° ° ° ° ° °

## 2020-09-12 NOTE — Progress Notes (Signed)
HPI Nancy Kline returns today for followup of palpitations. She is a pleasant 75 yo woman with PAF, HTN, dyslipidemia and chronic systemic anti-coagulation. She has had palpitations and wore a cardiac monitor demonstrating atrial fib with a RVR.  We started her on flecainide several weeks ago. In the interim, she notes that her palpitations have resolved. No sob. No edema. Allergies  Allergen Reactions  . Atorvastatin Other (See Comments)  . Carisoprodol Other (See Comments)  . Celebrex [Celecoxib] Other (See Comments)    hallucinations  . Crestor  [Rosuvastatin Calcium] Other (See Comments)  . Cyclobenzaprine Other (See Comments)  . Darvocet [Propoxyphene N-Acetaminophen] Other (See Comments)    hallucinations  . Darvon  [Propoxyphene] Other (See Comments)  . Etodolac Other (See Comments)  . Flexeril [Cyclobenzaprine Hcl] Other (See Comments)    hallucinations  . Gabapentin Other (See Comments)  . Hydrocodone-Acetaminophen Other (See Comments)  . Pramipexole Dihydrochloride Other (See Comments)  . Pregabalin Other (See Comments)    hallucinations  . Pregabalin Other (See Comments)  . Ropinirole Hcl     Other reaction(s): dizziness/nausea     Current Outpatient Medications  Medication Sig Dispense Refill  . buPROPion (WELLBUTRIN XL) 150 MG 24 hr tablet Take 150 mg by mouth daily.    . cholecalciferol (VITAMIN D) 1000 UNITS tablet Take 1,000 Units by mouth daily.    . clonazePAM (KLONOPIN) 0.5 MG tablet Take 0.5 tablets by mouth as needed.    . DULoxetine (CYMBALTA) 20 MG capsule Take 20 mg by mouth daily.    . flecainide (TAMBOCOR) 50 MG tablet Take 1 tablet (50 mg total) by mouth 2 (two) times daily. 60 tablet 11  . gabapentin (NEURONTIN) 300 MG capsule Take 300 mg by mouth daily.    Marland Kitchen glucosamine-chondroitin 500-400 MG tablet Take 1 tablet by mouth daily.    Marland Kitchen latanoprost (XALATAN) 0.005 % ophthalmic solution 1 drop at bedtime.    . meclizine (ANTIVERT) 50 MG tablet  Take 50 mg by mouth 3 (three) times daily as needed.    . pantoprazole (PROTONIX) 40 MG tablet Take 1 tablet by mouth daily as needed.     . predniSONE (DELTASONE) 10 MG tablet daily in the afternoon.    . propranolol (INDERAL) 10 MG tablet Take one tablet by mouth daily.  May take up to an additional 3 tablets by mouth daily for breakthrough palpitations. 90 tablet 3  . rivaroxaban (XARELTO) 20 MG TABS tablet Take 1 tablet (20 mg total) by mouth daily with supper. 90 tablet 1  . traMADol (ULTRAM) 50 MG tablet as needed.     No current facility-administered medications for this visit.     Past Medical History:  Diagnosis Date  . Atrial fibrillation (HCC)   . Carpal tunnel syndrome   . Chronic back pain   . GERD (gastroesophageal reflux disease)   . Hyperlipidemia   . Mitral valve prolapse   . Overactive bladder   . Palpitations   . Peripheral neuropathy   . RLS (restless legs syndrome)   . Sciatica   . Vertigo     ROS:   All systems reviewed and negative except as noted in the HPI.   Past Surgical History:  Procedure Laterality Date  . ATRIAL FLUTTER ABLATION  04/2011   ablation     Family History  Problem Relation Age of Onset  . Hypertension Mother   . Dementia Mother   . Breast cancer Sister  Social History   Socioeconomic History  . Marital status: Married    Spouse name: Nancy Huq Sr.   . Number of children: 2  . Years of education: BS  . Highest education level: Not on file  Occupational History  . Occupation: Retired    Associate Professor: RETIRED  Tobacco Use  . Smoking status: Never Smoker  . Smokeless tobacco: Never Used  Vaping Use  . Vaping Use: Never used  Substance and Sexual Activity  . Alcohol use: No  . Drug use: No  . Sexual activity: Not on file  Other Topics Concern  . Not on file  Social History Narrative   [name]   Caffeine Use: 1 cup of coffee monthly.    Social Determinants of Health    Financial Resource Strain: Not on file  Food Insecurity: Not on file  Transportation Needs: Not on file  Physical Activity: Not on file  Stress: Not on file  Social Connections: Not on file  Intimate Partner Violence: Not on file     BP (!) 152/74   Pulse 73   Ht 5\' 6"  (1.676 m)   Wt 190 lb 9.6 oz (86.5 kg)   SpO2 98%   BMI 30.76 kg/m   Physical Exam:  Well appearing NAD HEENT: Unremarkable Neck:  No JVD, no thyromegally Lymphatics:  No adenopathy Back:  No CVA tenderness Lungs:  Clear with no wheezes HEART:  Regular rate rhythm, no murmurs, no rubs, no clicks Abd:  soft, positive bowel sounds, no organomegally, no rebound, no guarding Ext:  2 plus pulses, no edema, no cyanosis, no clubbing Skin:  No rashes no nodules Neuro:  CN II through XII intact, motor grossly intact  EKG - nsr  DEVICE  Normal device function.  See PaceArt for details.   Assess/Plan: 1. PAF - She is maintaining NSR on flecainide. No change in flecainide  2. HTN - her SBP is elevated. She is encouraged to maintain a low sodium diet.  3. Coags - she has had no bleeding. Continue xarelto.  Nancy Koskela,MD

## 2020-09-13 DIAGNOSIS — M5416 Radiculopathy, lumbar region: Secondary | ICD-10-CM | POA: Diagnosis not present

## 2020-09-17 DIAGNOSIS — M7122 Synovial cyst of popliteal space [Baker], left knee: Secondary | ICD-10-CM | POA: Diagnosis not present

## 2020-09-29 ENCOUNTER — Other Ambulatory Visit: Payer: Self-pay | Admitting: Internal Medicine

## 2020-09-29 DIAGNOSIS — I480 Paroxysmal atrial fibrillation: Secondary | ICD-10-CM

## 2020-09-30 NOTE — Telephone Encounter (Signed)
Prescription refill request for Xarelto received.  Indication: a fib Last office visit: Weight: 190 Age: 75 Scr: 1.03 CrCl: 63 mL/min

## 2020-10-17 DIAGNOSIS — M25562 Pain in left knee: Secondary | ICD-10-CM | POA: Diagnosis not present

## 2020-10-17 DIAGNOSIS — M25561 Pain in right knee: Secondary | ICD-10-CM | POA: Diagnosis not present

## 2020-11-04 DIAGNOSIS — R351 Nocturia: Secondary | ICD-10-CM | POA: Diagnosis not present

## 2020-11-04 DIAGNOSIS — N3281 Overactive bladder: Secondary | ICD-10-CM | POA: Diagnosis not present

## 2020-11-04 DIAGNOSIS — R1030 Lower abdominal pain, unspecified: Secondary | ICD-10-CM | POA: Diagnosis not present

## 2020-11-04 DIAGNOSIS — R350 Frequency of micturition: Secondary | ICD-10-CM | POA: Diagnosis not present

## 2020-11-09 DIAGNOSIS — M7918 Myalgia, other site: Secondary | ICD-10-CM | POA: Diagnosis not present

## 2020-11-09 DIAGNOSIS — M62838 Other muscle spasm: Secondary | ICD-10-CM | POA: Diagnosis not present

## 2020-12-11 DIAGNOSIS — R109 Unspecified abdominal pain: Secondary | ICD-10-CM | POA: Diagnosis not present

## 2020-12-14 DIAGNOSIS — Z20828 Contact with and (suspected) exposure to other viral communicable diseases: Secondary | ICD-10-CM | POA: Diagnosis not present

## 2020-12-17 DIAGNOSIS — Z20828 Contact with and (suspected) exposure to other viral communicable diseases: Secondary | ICD-10-CM | POA: Diagnosis not present

## 2020-12-19 DIAGNOSIS — Z20828 Contact with and (suspected) exposure to other viral communicable diseases: Secondary | ICD-10-CM | POA: Diagnosis not present

## 2021-02-07 DIAGNOSIS — Z20828 Contact with and (suspected) exposure to other viral communicable diseases: Secondary | ICD-10-CM | POA: Diagnosis not present

## 2021-02-18 DIAGNOSIS — M170 Bilateral primary osteoarthritis of knee: Secondary | ICD-10-CM | POA: Diagnosis not present

## 2021-03-14 DIAGNOSIS — K219 Gastro-esophageal reflux disease without esophagitis: Secondary | ICD-10-CM | POA: Diagnosis not present

## 2021-03-14 DIAGNOSIS — R42 Dizziness and giddiness: Secondary | ICD-10-CM | POA: Diagnosis not present

## 2021-03-14 DIAGNOSIS — I480 Paroxysmal atrial fibrillation: Secondary | ICD-10-CM | POA: Diagnosis not present

## 2021-03-14 DIAGNOSIS — G609 Hereditary and idiopathic neuropathy, unspecified: Secondary | ICD-10-CM | POA: Diagnosis not present

## 2021-03-14 DIAGNOSIS — M7120 Synovial cyst of popliteal space [Baker], unspecified knee: Secondary | ICD-10-CM | POA: Diagnosis not present

## 2021-03-14 DIAGNOSIS — R519 Headache, unspecified: Secondary | ICD-10-CM | POA: Diagnosis not present

## 2021-03-14 DIAGNOSIS — R634 Abnormal weight loss: Secondary | ICD-10-CM | POA: Diagnosis not present

## 2021-03-18 ENCOUNTER — Other Ambulatory Visit: Payer: Self-pay

## 2021-03-18 ENCOUNTER — Ambulatory Visit: Payer: Medicare PPO | Admitting: Internal Medicine

## 2021-03-18 VITALS — BP 116/74 | HR 78 | Ht 66.0 in | Wt 186.2 lb

## 2021-03-18 DIAGNOSIS — I480 Paroxysmal atrial fibrillation: Secondary | ICD-10-CM | POA: Diagnosis not present

## 2021-03-18 NOTE — Patient Instructions (Signed)

## 2021-03-18 NOTE — Progress Notes (Signed)
HPI [name] returns today for followup of palpitations. She is a pleasant 75 yo woman with PAF, HTN, dyslipidemia and chronic systemic anti-coagulation. She has had palpitations and wore a cardiac monitor demonstrating atrial fib with a RVR.  We started her on flecainide several weeks ago. In the interim, she notes that her palpitations have resolved. No sob. No edema. Allergies  Allergen Reactions   Atorvastatin Other (See Comments)   Carisoprodol Other (See Comments)   Celebrex [Celecoxib] Other (See Comments)    hallucinations   Crestor  [Rosuvastatin Calcium] Other (See Comments)   Cyclobenzaprine Other (See Comments)   Darvocet [Propoxyphene N-Acetaminophen] Other (See Comments)    hallucinations   Darvon  [Propoxyphene] Other (See Comments)   Etodolac Other (See Comments)   Flexeril [Cyclobenzaprine Hcl] Other (See Comments)    hallucinations   Gabapentin Other (See Comments)   Hydrocodone-Acetaminophen Other (See Comments)   Pramipexole Dihydrochloride Other (See Comments)   Pregabalin Other (See Comments)    hallucinations   Pregabalin Other (See Comments)   Ropinirole Hcl     Other reaction(s): dizziness/nausea     Current Outpatient Medications  Medication Sig Dispense Refill   buPROPion (WELLBUTRIN XL) 150 MG 24 hr tablet Take 150 mg by mouth daily.     cholecalciferol (VITAMIN D) 1000 UNITS tablet Take 1,000 Units by mouth daily.     clonazePAM (KLONOPIN) 0.5 MG tablet Take 0.5 tablets by mouth as needed.     DULoxetine (CYMBALTA) 20 MG capsule Take 20 mg by mouth daily.     flecainide (TAMBOCOR) 50 MG tablet Take 1 tablet (50 mg total) by mouth 2 (two) times daily. 60 tablet 11   gabapentin (NEURONTIN) 300 MG capsule Take 300 mg by mouth daily.     glucosamine-chondroitin 500-400 MG tablet Take 1 tablet by mouth daily.     latanoprost (XALATAN) 0.005 % ophthalmic solution 1 drop at bedtime.     meclizine (ANTIVERT) 50 MG tablet Take 50 mg by mouth 3  (three) times daily as needed.     pantoprazole (PROTONIX) 40 MG tablet Take 1 tablet by mouth daily as needed.      predniSONE (DELTASONE) 10 MG tablet daily in the afternoon.     propranolol (INDERAL) 10 MG tablet Take one tablet by mouth daily.  May take up to an additional 3 tablets by mouth daily for breakthrough palpitations. 90 tablet 3   traMADol (ULTRAM) 50 MG tablet as needed.     XARELTO 20 MG TABS tablet TAKE 1 TABLET (20 MG TOTAL) BY MOUTH DAILY WITH SUPPER. 90 tablet 1   No current facility-administered medications for this visit.     Past Medical History:  Diagnosis Date   Atrial fibrillation (HCC)    Carpal tunnel syndrome    Chronic back pain    GERD (gastroesophageal reflux disease)    Hyperlipidemia    Mitral valve prolapse    Overactive bladder    Palpitations    Peripheral neuropathy    RLS (restless legs syndrome)    Sciatica    Vertigo     ROS:   All systems reviewed and negative except as noted in the HPI.   Past Surgical History:  Procedure Laterality Date   ATRIAL FLUTTER ABLATION  04/2011   ablation     Family History  Problem Relation Age of Onset   Hypertension Mother    Dementia Mother    Breast cancer Sister  Social History   Socioeconomic History   Marital status: Married    Spouse name: Merchant navy officer Sr.    Number of children: 2   Years of education: BS   Highest education level: Not on file  Occupational History   Occupation: Retired    Associate Professor: RETIRED  Tobacco Use   Smoking status: Never   Smokeless tobacco: Never  Vaping Use   Vaping Use: Never used  Substance and Sexual Activity   Alcohol use: No   Drug use: No   Sexual activity: Not on file  Other Topics Concern   Not on file  Social History Narrative   [name]   Caffeine Use: 1 cup of coffee monthly.    Social Determinants of Health   Financial Resource Strain: Not on file  Food Insecurity: Not on file  Transportation  Needs: Not on file  Physical Activity: Not on file  Stress: Not on file  Social Connections: Not on file  Intimate Partner Violence: Not on file     BP 116/74   Pulse 78   Ht 5\' 6"  (1.676 m)   Wt 186 lb 3.2 oz (84.5 kg)   SpO2 99%   BMI 30.05 kg/m   Physical Exam:  Well appearing NAD HEENT: Unremarkable Neck:  No JVD, no thyromegally Lymphatics:  No adenopathy Back:  No CVA tenderness Lungs:  Clear with no wheezes HEART:  Regular rate rhythm, no murmurs, no rubs, no clicks Abd:  soft, positive bowel sounds, no organomegally, no rebound, no guarding Ext:  2 plus pulses, no edema, no cyanosis, no clubbing Skin:  No rashes no nodules Neuro:  CN II through XII intact, motor grossly intact  EKG - nsr   Assess/Plan:   1. PAF - She is maintaining NSR on flecainide. No change in flecainide. She will continue propranolol   2. HTN - her SBP is improved. She is encouraged to maintain a low sodium diet.   3. Coags - she has had no bleeding. Continue xarelto.  4. Covid - she had Covid in the spring with only a mild case and has recovered nicely.    02-01-1991 Maizy Davanzo,MD

## 2021-03-20 DIAGNOSIS — M170 Bilateral primary osteoarthritis of knee: Secondary | ICD-10-CM | POA: Diagnosis not present

## 2021-03-20 DIAGNOSIS — M7122 Synovial cyst of popliteal space [Baker], left knee: Secondary | ICD-10-CM | POA: Diagnosis not present

## 2021-03-24 DIAGNOSIS — R0602 Shortness of breath: Secondary | ICD-10-CM | POA: Diagnosis not present

## 2021-03-24 DIAGNOSIS — Z20828 Contact with and (suspected) exposure to other viral communicable diseases: Secondary | ICD-10-CM | POA: Diagnosis not present

## 2021-03-24 DIAGNOSIS — Z6829 Body mass index (BMI) 29.0-29.9, adult: Secondary | ICD-10-CM | POA: Diagnosis not present

## 2021-03-24 DIAGNOSIS — J069 Acute upper respiratory infection, unspecified: Secondary | ICD-10-CM | POA: Diagnosis not present

## 2021-04-02 DIAGNOSIS — M25561 Pain in right knee: Secondary | ICD-10-CM | POA: Diagnosis not present

## 2021-04-02 DIAGNOSIS — M25562 Pain in left knee: Secondary | ICD-10-CM | POA: Diagnosis not present

## 2021-04-02 DIAGNOSIS — M170 Bilateral primary osteoarthritis of knee: Secondary | ICD-10-CM | POA: Diagnosis not present

## 2021-04-02 DIAGNOSIS — Z1231 Encounter for screening mammogram for malignant neoplasm of breast: Secondary | ICD-10-CM | POA: Diagnosis not present

## 2021-04-16 DIAGNOSIS — R922 Inconclusive mammogram: Secondary | ICD-10-CM | POA: Diagnosis not present

## 2021-04-16 DIAGNOSIS — R921 Mammographic calcification found on diagnostic imaging of breast: Secondary | ICD-10-CM | POA: Diagnosis not present

## 2021-04-16 DIAGNOSIS — R928 Other abnormal and inconclusive findings on diagnostic imaging of breast: Secondary | ICD-10-CM | POA: Diagnosis not present

## 2021-05-01 ENCOUNTER — Other Ambulatory Visit: Payer: Self-pay

## 2021-05-01 DIAGNOSIS — D0511 Intraductal carcinoma in situ of right breast: Secondary | ICD-10-CM | POA: Diagnosis not present

## 2021-05-05 ENCOUNTER — Telehealth: Payer: Self-pay | Admitting: Hematology and Oncology

## 2021-05-05 NOTE — Telephone Encounter (Signed)
Spoke to patient to confirm afternoon clinic appointment for 11/2, [facility] will send packet

## 2021-05-06 ENCOUNTER — Encounter: Payer: Self-pay | Admitting: Obstetrics & Gynecology

## 2021-05-09 ENCOUNTER — Encounter: Payer: Self-pay | Admitting: *Deleted

## 2021-05-09 DIAGNOSIS — D0511 Intraductal carcinoma in situ of right breast: Secondary | ICD-10-CM | POA: Insufficient documentation

## 2021-05-13 NOTE — Progress Notes (Signed)
[facility] NOTE  Patient Care Team: Shirline Frees, MD as PCP - General (Family Medicine) Evans Lance, MD as PCP - Electrophysiology (Cardiology) Jovita Kussmaul, MD as Consulting Physician (General Surgery) Nicholas Lose, MD as Consulting Physician (Hematology and Oncology) Kyung Rudd, MD as Consulting Physician (Radiation Oncology) Rockwell Germany, RN as Oncology Nurse Navigator Mauro Kaufmann, RN as Oncology Nurse Navigator  CHIEF COMPLAINTS/PURPOSE OF CONSULTATION:  Newly diagnosed breast cancer  HISTORY OF PRESENTING ILLNESS:  Nancy Kline 75 y.o. female is here because of recent diagnosis of DCIS of the right breast. Screening mammogram on 04/02/2021 showed indeterminate grouped calcifications in the right breast. Diagnostic mammogram and [place] on 04/16/2021 showed fine branching linear calcifications in the right breast. Biopsy on 05/01/2021 showed DCIS with calcifications, ER/PR+(100%). She presents to the clinic today for initial evaluation and discussion of treatment options.   I reviewed her records extensively and collaborated the history with the patient.  SUMMARY OF ONCOLOGIC HISTORY: Oncology History  Ductal carcinoma in situ (DCIS) of right breast  05/01/2021 Initial Diagnosis   Screening mammogram: indeterminate grouped calcifications's and a 1 cm in the right breast.  Stereotactic biopsy: Low-grade DCIS with calcifications, ER/PR+(100%).   05/14/2021 Cancer Staging   Staging form: Breast, AJCC 8th Edition - Clinical stage from 05/14/2021: Stage 0 (cTis (DCIS), cN0, cM0, ER+, PR+, HER2: Not Assessed) - Signed by Nicholas Lose, MD on 05/14/2021 Nuclear grade: G1      MEDICAL HISTORY:  Past Medical History:  Diagnosis Date   Atrial fibrillation (HCC)    Carpal tunnel syndrome    Chronic back pain    GERD (gastroesophageal reflux disease)    Hyperlipidemia    Mitral valve prolapse    Overactive bladder    Palpitations    Peripheral  neuropathy    RLS (restless legs syndrome)    Sciatica    Vertigo     SURGICAL HISTORY: Past Surgical History:  Procedure Laterality Date   ATRIAL FLUTTER ABLATION  04/2011   ablation    SOCIAL HISTORY: Social History   Socioeconomic History   Marital status: Married    Spouse name: Building control surveyor Sr.    Number of children: 2   Years of education: BS   Highest education level: Not on file  Occupational History   Occupation: Retired    Fish farm manager: RETIRED  Tobacco Use   Smoking status: Never   Smokeless tobacco: Never  Vaping Use   Vaping Use: Never used  Substance and Sexual Activity   Alcohol use: No   Drug use: No   Sexual activity: Not on file  Other Topics Concern   Not on file  Social History Narrative   [name]   Caffeine Use: 1 cup of coffee monthly.    Social Determinants of Health   Financial Resource Strain: Not on file  Food Insecurity: Not on file  Transportation Needs: Not on file  Physical Activity: Not on file  Stress: Not on file  Social Connections: Not on file  Intimate Partner Violence: Not on file    FAMILY HISTORY: Family History  Problem Relation Age of Onset   Hypertension Mother    Dementia Mother    Breast cancer Sister     ALLERGIES:  is allergic to atorvastatin, carisoprodol, celebrex [celecoxib], crestor  [rosuvastatin calcium], cyclobenzaprine, darvocet [propoxyphene n-acetaminophen], darvon  [propoxyphene], etodolac, flexeril [cyclobenzaprine hcl], gabapentin, hydrocodone-acetaminophen, pramipexole dihydrochloride, pregabalin, pregabalin, and ropinirole hcl.  MEDICATIONS:  Current Outpatient Medications  Medication Sig Dispense Refill   buPROPion (WELLBUTRIN XL) 150 MG 24 hr tablet Take 150 mg by mouth daily.     cholecalciferol (VITAMIN D) 1000 UNITS tablet Take 1,000 Units by mouth daily.     clonazePAM (KLONOPIN) 0.5 MG tablet Take 0.5 tablets by mouth as needed.     DULoxetine (CYMBALTA) 20 MG  capsule Take 20 mg by mouth daily.     flecainide (TAMBOCOR) 50 MG tablet Take 1 tablet (50 mg total) by mouth 2 (two) times daily. 60 tablet 11   gabapentin (NEURONTIN) 300 MG capsule Take 300 mg by mouth daily.     glucosamine-chondroitin 500-400 MG tablet Take 1 tablet by mouth daily.     latanoprost (XALATAN) 0.005 % ophthalmic solution 1 drop at bedtime.     meclizine (ANTIVERT) 50 MG tablet Take 50 mg by mouth 3 (three) times daily as needed.     pantoprazole (PROTONIX) 40 MG tablet Take 1 tablet by mouth daily as needed.      predniSONE (DELTASONE) 10 MG tablet daily in the afternoon.     propranolol (INDERAL) 10 MG tablet Take one tablet by mouth daily.  May take up to an additional 3 tablets by mouth daily for breakthrough palpitations. 90 tablet 3   traMADol (ULTRAM) 50 MG tablet as needed.     XARELTO 20 MG TABS tablet TAKE 1 TABLET (20 MG TOTAL) BY MOUTH DAILY WITH SUPPER. 90 tablet 1   No current facility-administered medications for this visit.    REVIEW OF SYSTEMS:   Constitutional: Denies fevers, chills or abnormal night sweats Eyes: Denies blurriness of vision, double vision or watery eyes Ears, nose, mouth, throat, and face: Denies mucositis or sore throat Respiratory: Denies cough, dyspnea or wheezes Cardiovascular: Denies palpitation, chest discomfort or lower extremity swelling Gastrointestinal:  Denies nausea, heartburn or change in bowel habits Skin: Denies abnormal skin rashes Lymphatics: Denies new lymphadenopathy or easy bruising Neurological:Denies numbness, tingling or new weaknesses Behavioral/Psych: Mood is stable, no new changes  Breast:  Denies any palpable lumps or discharge All other systems were reviewed with the patient and are negative.  PHYSICAL EXAMINATION: ECOG PERFORMANCE STATUS: 1 - Symptomatic but completely ambulatory  Vitals:   05/14/21 1304  BP: (!) 139/43  Pulse: 72  Resp: 16  Temp: 98.1 F (36.7 C)  SpO2: 100%   Filed Weights    05/14/21 1304  Weight: 191 lb 6.4 oz (86.8 kg)      LABORATORY DATA:  I have reviewed the data as listed Lab Results  Component Value Date   WBC 4.4 05/14/2021   HGB 9.6 (L) 05/14/2021   HCT 27.9 (L) 05/14/2021   MCV 98.9 05/14/2021   PLT 167 05/14/2021   Lab Results  Component Value Date   NA 137 05/14/2021   K 4.1 05/14/2021   CL 101 05/14/2021   CO2 28 05/14/2021    RADIOGRAPHIC STUDIES: I have personally reviewed the radiological reports and agreed with the findings in the report.  ASSESSMENT AND PLAN:  Ductal carcinoma in situ (DCIS) of right breast 05/01/2021:Screening mammogram: indeterminate grouped calcifications's and a 1 cm in the right breast.  Stereotactic biopsy: Low-grade DCIS with calcifications, ER/PR+(100%).  Pathology review: I discussed with the patient the difference between DCIS and invasive breast cancer. It is considered a precancerous lesion. DCIS is classified as a 0. It is generally detected through mammograms as calcifications. We discussed the significance of grades and its impact  on prognosis. We also discussed the importance of ER and PR receptors and their implications to adjuvant treatment options. Prognosis of DCIS dependence on grade, comedo necrosis. It is anticipated that if not treated, 20-30% of DCIS can develop into invasive breast cancer.  Recommendation: 1. Breast conserving surgery vs COMET trial 2. +/- adjuvant radiation therapy 3. Followed by antiestrogen therapy with tamoxifen 5 years  AFT 25 COMET Phase 3 clinical trial for low risk DCIS grade 1/2 PR positive, age greater than 40 randomized to surgery +/- radiation, +/- endocrine therapy versus active surveillance with +/- endocrine therapy surveillance with mammograms every 6 months for 5 years;patient's have option to decline elevated arm and still be followed on study    Tamoxifen counseling: We discussed the risks and benefits of tamoxifen. These include but not limited to  insomnia, hot flashes, mood changes, vaginal dryness, and weight gain. Although rare, serious side effects including endometrial cancer, risk of blood clots were also discussed. We strongly believe that the benefits far outweigh the risks. Patient understands these risks and consented to starting treatment. Planned treatment duration is 5 years.  Return to clinic after surgery to discuss the final pathology report and come up with an adjuvant treatment plan. Patient has recently moved to [place] and she might consider moving her care to be closer to her home.    All questions were answered. The patient knows to call the clinic with any problems, questions or concerns.   Rulon Eisenmenger, MD, MPH 05/14/2021    I, Thana Ates, am acting as scribe for Nicholas Lose, MD.  I have reviewed the above documentation for accuracy and completeness, and I agree with the above.

## 2021-05-14 ENCOUNTER — Inpatient Hospital Stay: Payer: Medicare PPO | Attending: Hematology and Oncology

## 2021-05-14 ENCOUNTER — Encounter: Payer: Self-pay | Admitting: General Practice

## 2021-05-14 ENCOUNTER — Other Ambulatory Visit: Payer: Self-pay

## 2021-05-14 ENCOUNTER — Ambulatory Visit: Payer: Medicare PPO | Admitting: Physical Therapy

## 2021-05-14 ENCOUNTER — Encounter: Payer: Self-pay | Admitting: *Deleted

## 2021-05-14 ENCOUNTER — Encounter: Payer: Self-pay | Admitting: Genetic Counselor

## 2021-05-14 ENCOUNTER — Ambulatory Visit
Admission: RE | Admit: 2021-05-14 | Discharge: 2021-05-14 | Disposition: A | Discharge status: Home / Self Care | Payer: Medicare PPO | Source: Ambulatory Visit | Attending: Radiation Oncology | Admitting: Radiation Oncology

## 2021-05-14 ENCOUNTER — Inpatient Hospital Stay (HOSPITAL_BASED_OUTPATIENT_CLINIC_OR_DEPARTMENT_OTHER): Payer: Medicare PPO | Admitting: Hematology and Oncology

## 2021-05-14 ENCOUNTER — Ambulatory Visit (HOSPITAL_BASED_OUTPATIENT_CLINIC_OR_DEPARTMENT_OTHER): Payer: Medicare PPO | Admitting: Genetic Counselor

## 2021-05-14 ENCOUNTER — Ambulatory Visit: Admission: RE | Admit: 2021-05-14 | Payer: Medicare PPO | Source: Ambulatory Visit | Admitting: Radiation Oncology

## 2021-05-14 DIAGNOSIS — Z170 Estrogen receptor positive status [ER+]: Secondary | ICD-10-CM

## 2021-05-14 DIAGNOSIS — D0511 Intraductal carcinoma in situ of right breast: Secondary | ICD-10-CM

## 2021-05-14 DIAGNOSIS — Z803 Family history of malignant neoplasm of breast: Secondary | ICD-10-CM | POA: Diagnosis not present

## 2021-05-14 DIAGNOSIS — Z1379 Encounter for other screening for genetic and chromosomal anomalies: Secondary | ICD-10-CM | POA: Insufficient documentation

## 2021-05-14 LAB — CBC WITH DIFFERENTIAL (CANCER CENTER ONLY)
Abs Immature Granulocytes: 0.01 10*3/uL (ref 0.00–0.07)
Basophils Absolute: 0 10*3/uL (ref 0.0–0.1)
Basophils Relative: 1 %
Eosinophils Absolute: 0.1 10*3/uL (ref 0.0–0.5)
Eosinophils Relative: 2 %
HCT: 27.9 % — ABNORMAL LOW (ref 36.0–46.0)
Hemoglobin: 9.6 g/dL — ABNORMAL LOW (ref 12.0–15.0)
Immature Granulocytes: 0 %
Lymphocytes Relative: 24 %
Lymphs Abs: 1.1 10*3/uL (ref 0.7–4.0)
MCH: 34 pg (ref 26.0–34.0)
MCHC: 34.4 g/dL (ref 30.0–36.0)
MCV: 98.9 fL (ref 80.0–100.0)
Monocytes Absolute: 0.6 10*3/uL (ref 0.1–1.0)
Monocytes Relative: 13 %
Neutro Abs: 2.7 10*3/uL (ref 1.7–7.7)
Neutrophils Relative %: 60 %
Platelet Count: 167 10*3/uL (ref 150–400)
RBC: 2.82 MIL/uL — ABNORMAL LOW (ref 3.87–5.11)
RDW: 15.9 % — ABNORMAL HIGH (ref 11.5–15.5)
WBC Count: 4.4 10*3/uL (ref 4.0–10.5)
nRBC: 1.1 % — ABNORMAL HIGH (ref 0.0–0.2)

## 2021-05-14 LAB — CMP (CANCER CENTER ONLY)
ALT: 8 U/L (ref 0–44)
AST: 18 U/L (ref 15–41)
Albumin: 3.8 g/dL (ref 3.5–5.0)
Alkaline Phosphatase: 62 U/L (ref 38–126)
Anion gap: 8 (ref 5–15)
BUN: 11 mg/dL (ref 8–23)
CO2: 28 mmol/L (ref 22–32)
Calcium: 9.1 mg/dL (ref 8.9–10.3)
Chloride: 101 mmol/L (ref 98–111)
Creatinine: 0.91 mg/dL (ref 0.44–1.00)
GFR, Estimated: >60 mL/min (ref >60)
Glucose, Bld: 109 mg/dL — ABNORMAL HIGH (ref 70–99)
Potassium: 4.1 mmol/L (ref 3.5–5.1)
Sodium: 137 mmol/L (ref 135–145)
Total Bilirubin: 0.9 mg/dL (ref 0.3–1.2)
Total Protein: 7 g/dL (ref 6.5–8.1)

## 2021-05-14 LAB — GENETIC SCREENING ORDER

## 2021-05-14 NOTE — Progress Notes (Signed)
Radiation Oncology         [phone] ________________________________  Name: Nancy Kline        MRN: [id number]  Date of Service: 05/14/2021 DOB: [birth year]  CM:[id number], Gwyndolyn Saxon, MD  Jovita Kussmaul, MD     REFERRING PHYSICIAN: Jovita Kussmaul, MD   DIAGNOSIS: The encounter diagnosis was Ductal carcinoma in situ (DCIS) of right breast.   HISTORY OF PRESENT ILLNESS: Nancy Kline is a 75 y.o. female seen in the multidisciplinary breast clinic for a new diagnosis of right breast cancer. The patient was noted to have right calcifications on screening mammogram. She was found to have a 1 cm span of calcifications and biopsy showed low grade DCIS with calcifications that was ER/PR positive. She is seen today to discuss treatment recommendations of her cancer. Marland Kitchen    PREVIOUS RADIATION THERAPY: No   PAST MEDICAL HISTORY:  Past Medical History:  Diagnosis Date   Atrial fibrillation (HCC)    Carpal tunnel syndrome    Chronic back pain    GERD (gastroesophageal reflux disease)    Hyperlipidemia    Mitral valve prolapse    Overactive bladder    Palpitations    Peripheral neuropathy    RLS (restless legs syndrome)    Sciatica    Vertigo        PAST SURGICAL HISTORY: Past Surgical History:  Procedure Laterality Date   ATRIAL FLUTTER ABLATION  04/2011   ablation     FAMILY HISTORY:  Family History  Problem Relation Age of Onset   Hypertension Mother    Dementia Mother    Breast cancer Sister      SOCIAL HISTORY:  reports that she has never smoked. She has never used smokeless tobacco. She reports that she does not drink alcohol and does not use drugs. The patient is married and lives in [place], [place] and just moved from [place] to be closer to her daughter.    ALLERGIES: Atorvastatin, Carisoprodol, Celebrex [celecoxib], Crestor  [rosuvastatin calcium], Cyclobenzaprine, Darvocet [propoxyphene n-acetaminophen], Darvon  [propoxyphene], Etodolac, Flexeril  [cyclobenzaprine hcl], Gabapentin, Hydrocodone-acetaminophen, Pramipexole dihydrochloride, Pregabalin, Pregabalin, and Ropinirole hcl   MEDICATIONS:  Current Outpatient Medications  Medication Sig Dispense Refill   buPROPion (WELLBUTRIN XL) 150 MG 24 hr tablet Take 150 mg by mouth daily.     cholecalciferol (VITAMIN D) 1000 UNITS tablet Take 1,000 Units by mouth daily.     clonazePAM (KLONOPIN) 0.5 MG tablet Take 0.5 tablets by mouth as needed.     DULoxetine (CYMBALTA) 20 MG capsule Take 20 mg by mouth daily.     flecainide (TAMBOCOR) 50 MG tablet Take 1 tablet (50 mg total) by mouth 2 (two) times daily. 60 tablet 11   gabapentin (NEURONTIN) 300 MG capsule Take 300 mg by mouth daily.     glucosamine-chondroitin 500-400 MG tablet Take 1 tablet by mouth daily.     latanoprost (XALATAN) 0.005 % ophthalmic solution 1 drop at bedtime.     meclizine (ANTIVERT) 50 MG tablet Take 50 mg by mouth 3 (three) times daily as needed.     pantoprazole (PROTONIX) 40 MG tablet Take 1 tablet by mouth daily as needed.      predniSONE (DELTASONE) 10 MG tablet daily in the afternoon.     propranolol (INDERAL) 10 MG tablet Take one tablet by mouth daily.  May take up to an additional 3 tablets by mouth daily for breakthrough palpitations. 90 tablet 3   traMADol (ULTRAM) 50 MG tablet as needed.  XARELTO 20 MG TABS tablet TAKE 1 TABLET (20 MG TOTAL) BY MOUTH DAILY WITH SUPPER. 90 tablet 1   No current facility-administered medications for this encounter.     REVIEW OF SYSTEMS: On review of systems, the patient reports that she is doing well overall. She has had some bruising of her breast and soreness since her biopsy. No other complaints are verbalized.      PHYSICAL EXAM:  Wt Readings from Last 3 Encounters:  05/14/21 191 lb 6.4 oz (86.8 kg)  03/18/21 186 lb 3.2 oz (84.5 kg)  09/12/20 190 lb 9.6 oz (86.5 kg)   Temp Readings from Last 3 Encounters:  05/14/21 98.1 F (36.7 C) (Tympanic)  07/02/16  98.8 F (37.1 C) (Oral)  11/15/12 98.5 F (36.9 C) (Oral)   BP Readings from Last 3 Encounters:  05/14/21 (!) 139/43  03/18/21 116/74  09/12/20 (!) 152/74   Pulse Readings from Last 3 Encounters:  05/14/21 72  03/18/21 78  09/12/20 73    In general this is a well appearing African American female in no acute distress. She's alert and oriented x4 and appropriate throughout the examination. Cardiopulmonary assessment is negative for acute distress and she exhibits normal effort. Bilateral breast exam is deferred.    ECOG = 1  0 - Asymptomatic (Fully active, able to carry on all predisease activities without restriction)  1 - Symptomatic but completely ambulatory (Restricted in physically strenuous activity but ambulatory and able to carry out work of a light or sedentary nature. For example, light housework, office work)  2 - Symptomatic, <50% in bed during the day (Ambulatory and capable of all self care but unable to carry out any work activities. Up and about more than 50% of waking hours)  3 - Symptomatic, >50% in bed, but not bedbound (Capable of only limited self-care, confined to bed or chair 50% or more of waking hours)  4 - Bedbound (Completely disabled. Cannot carry on any self-care. Totally confined to bed or chair)  5 - Death   Santiago Glad MM, Creech RH, Tormey DC, et al. [phone]). "Toxicity and response criteria of the [facility] Group". Am. Evlyn Clines. Oncol. 5 (6): 649-55    LABORATORY DATA:  Lab Results  Component Value Date   WBC 4.4 05/14/2021   HGB 9.6 (L) 05/14/2021   HCT 27.9 (L) 05/14/2021   MCV 98.9 05/14/2021   PLT 167 05/14/2021   Lab Results  Component Value Date   NA 137 05/14/2021   K 4.1 05/14/2021   CL 101 05/14/2021   CO2 28 05/14/2021   Lab Results  Component Value Date   ALT 8 05/14/2021   AST 18 05/14/2021   ALKPHOS 62 05/14/2021   BILITOT 0.9 05/14/2021      RADIOGRAPHY: No results found.      IMPRESSION/PLAN: 1. Low grade, ER/PR positive DCIS of the right breast. Dr. Mitzi Hansen discusses the pathology findings and reviews the nature of early stage breast disease. The consensus from the breast conference includes breast conservation with lumpectomy versus possible enrollment into comet trial. She is not interested in trial, but would proceed with lumpectomy. We discussed the rationale for external radiotherapy to the breast  to reduce risks of local recurrence followed by antiestrogen therapy. We also discussed rationale for favorable scenarios for which she could avoid radiation. She is interested however in remaining aggressive about her local control of her cancer. We discussed the risks, benefits, short, and long term effects of radiotherapy, as  well as the curative intent, and the patient is interested in proceeding. I discussed the delivery and logistics of radiotherapy and . Dr. Lisbeth Renshaw anticipates a course of 4 weeks of radiotherapy to the right breast. She would like treatment closer to home in the [facility] area and we will refer her for discussion  In a visit lasting 60 minutes, greater than 50% of the time was spent face to face reviewing her case, as well as in preparation of, discussing, and coordinating the patient's care.    Carola Rhine, [facility]    **Disclaimer: This note was dictated with voice recognition software. Similar sounding words can inadvertently be transcribed and this note may contain transcription errors which may not have been corrected upon publication of note.**

## 2021-05-14 NOTE — Progress Notes (Signed)
[facility] [place] Psychosocial Distress Screening Spiritual Care  Met with Nancy Kline and her daughter "Nancy Kline," who is a Software engineer, in [facility] to introduce [facility] team/resources, reviewing distress screen per protocol.  The patient scored a 6 on the Psychosocial Distress Thermometer which indicates moderate distress. Also assessed for distress and other psychosocial needs.   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 Her husband died ca two years ago, and she is working on Medical illustrator as she has relocated from the [place] area to a smaller home in [place], [place] to be close to her daughter. [name]'s faith is very important to her.  Provided empathic listening, normalization of feelings, emotional support, and encouragement to participate in [facility] online support programming, which is accessible even at a distance.  Follow up needed: Yes.  We plan to follow up by phone in ca two weeks. At that time she may be more clear on whether an [facility] would be a helpful resource for her.   [place], [place], [facility] Pager [phone] Voicemail [phone]

## 2021-05-14 NOTE — Assessment & Plan Note (Signed)
05/01/2021:Screening mammogram: indeterminate grouped calcifications's and a 1 cm in the right breast.  Stereotactic biopsy: Low-grade DCIS with calcifications, ER/PR+(100%).  Pathology review: I discussed with the patient the difference between DCIS and invasive breast cancer. It is considered a precancerous lesion. DCIS is classified as a 0. It is generally detected through mammograms as calcifications. We discussed the significance of grades and its impact on prognosis. We also discussed the importance of ER and PR receptors and their implications to adjuvant treatment options. Prognosis of DCIS dependence on grade, comedo necrosis. It is anticipated that if not treated, 20-30% of DCIS can develop into invasive breast cancer.  Recommendation: 1. Breast conserving surgery 2. +/- adjuvant radiation therapy 3. Followed by antiestrogen therapy with tamoxifen 5 years  Tamoxifen counseling: We discussed the risks and benefits of tamoxifen. These include but not limited to insomnia, hot flashes, mood changes, vaginal dryness, and weight gain. Although rare, serious side effects including endometrial cancer, risk of blood clots were also discussed. We strongly believe that the benefits far outweigh the risks. Patient understands these risks and consented to starting treatment. Planned treatment duration is 5 years.  Return to clinic after surgery to discuss the final pathology report and come up with an adjuvant treatment plan.

## 2021-05-15 DIAGNOSIS — Z803 Family history of malignant neoplasm of breast: Secondary | ICD-10-CM | POA: Insufficient documentation

## 2021-05-15 NOTE — Progress Notes (Signed)
REFERRING PROVIDER: Nicholas Lose, MD [address] [place], [place] [address]  PRIMARY PROVIDER:  Shirline Frees, MD  PRIMARY REASON FOR VISIT:  1. Ductal carcinoma in situ (DCIS) of right breast   2. Family history of breast cancer     HISTORY OF PRESENT ILLNESS:   Nancy Kline, a 75 y.o. female, was seen for a [place] cancer genetics consultation during the breast multidisciplinary clinic at the request of Dr. Lindi Adie due to a personal and family history of cancer.  Nancy Kline presents to clinic today to discuss the possibility of a hereditary predisposition to cancer, to discuss genetic testing, and to further clarify her future cancer risks, as well as potential cancer risks for family members.   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   CANCER HISTORY:  Oncology History  Ductal carcinoma in situ (DCIS) of right breast  05/01/2021 Initial Diagnosis   Screening mammogram: indeterminate grouped calcifications's and a 1 cm in the right breast.  Stereotactic biopsy: Low-grade DCIS with calcifications, ER/PR+(100%).   05/14/2021 Cancer Staging   Staging form: Breast, AJCC 8th Edition - Clinical stage from 05/14/2021: Stage 0 (cTis (DCIS), cN0, cM0, ER+, PR+, HER2: Not Assessed) - Signed by Nicholas Lose, MD on 05/14/2021 Nuclear grade: G1       Past Medical History:  Diagnosis Date   Atrial fibrillation ([place])    Carpal tunnel syndrome    Chronic back pain    GERD (gastroesophageal reflux disease)    Hyperlipidemia    Mitral valve prolapse    Overactive bladder    Palpitations    Peripheral neuropathy    RLS (restless legs syndrome)    Sciatica    Vertigo     Past Surgical History:  Procedure Laterality Date   ATRIAL FLUTTER ABLATION  04/2011   ablation   CATARACT EXTRACTION      Social History   Socioeconomic History   Marital status: Married    Spouse name: Building control surveyor Sr.     Number of children: 2   Years of education: BS   Highest education level: Not on file  Occupational History   Occupation: Retired    Fish farm manager: RETIRED  Tobacco Use   Smoking status: Never   Smokeless tobacco: Never  Vaping Use   Vaping Use: Never used  Substance and Sexual Activity   Alcohol use: No   Drug use: No   Sexual activity: Not on file  Other Topics Concern   Not on file  Social History Narrative   [name]   Caffeine Use: 1 cup of coffee monthly.    Social Determinants of Health   Financial Resource Strain: Not on file  Food Insecurity: Not on file  Transportation Needs: Not on file  Physical Activity: Not on file  Stress: Not on file  Social Connections: Not on file     FAMILY HISTORY:  We obtained a detailed, 4-generation family history.  Significant diagnoses are listed below: Family History  Problem Relation Age of Onset   Hypertension Mother    Dementia Mother    Breast cancer Sister 2   Breast cancer Sister 67      Ms. Strohecker is unaware of previous family history of genetic testing for hereditary cancer risks. There no reported Ashkenazi Jewish ancestry.  GENETIC COUNSELING ASSESSMENT: Nancy Kline is a 75 y.o. female with a personal and  family history of cancer which is somewhat suggestive of a hereditary cancer syndrome and predisposition to cancer given multiple family members with breast cancer. We, therefore, discussed and recommended the following at today's visit.   DISCUSSION: We discussed that 5 - 10% of cancer is hereditary, with most cases of hereditary breast cancer associated with mutations in BRCA1/2.  There are other genes that can be associated with hereditary breast cancer syndromes. Type of cancer risk and level of risk are gene-specific. We discussed that testing is beneficial for several reasons including knowing how to follow individuals after completing their treatment, identifying whether potential treatment  options would be beneficial, and understanding if other family members could be at risk for cancer and allowing them to undergo genetic testing.   We reviewed the characteristics, features and inheritance patterns of hereditary cancer syndromes. We also discussed genetic testing, including the appropriate family members to test, the process of testing, insurance coverage and turn-around-time for results. We discussed the implications of a negative, positive and/or variant of uncertain significant result.   Nancy Kline  was offered a common hereditary cancer panel (47 genes) and an expanded pan-cancer panel (77 genes). Nancy Kline was informed of the benefits and limitations of each panel, including that expanded pan-cancer panels contain genes that do not have clear management guidelines at this point in time.  We also discussed that as the number of genes included on a panel increases, the chances of variants of uncertain significance increases.  After considering the benefits and limitations of each gene panel, Nancy Kline declined genetic testing.    PLAN:  Despite our recommendation, Ms. Dingley did not wish to pursue genetic testing at today's visit. We understand this decision and remain available to coordinate genetic testing at any time in the future. We, therefore, recommend Nancy Kline continue to follow the cancer screening guidelines given by her primary healthcare provider.  Ms. Wolf questions were answered to her satisfaction today. Our contact information was provided should additional questions or concerns arise. Thank you for the referral and allowing [place] to share in the care of your patient.   Lucille Passy, MS, [facility] Genetic Counselor [email or web address] (P) [phone]  The patient was seen for a total of 20 minutes in face-to-face genetic counseling.  The patient brought her daughter. Drs. Magrinat, Lindi Adie and/or Burr Medico were available to discuss this case as needed.   _______________________________________________________________________ For Office Staff:  Number of people involved in session: 2 Was an Intern/ student involved with case: no

## 2021-05-19 ENCOUNTER — Other Ambulatory Visit: Payer: Self-pay

## 2021-05-19 ENCOUNTER — Telehealth: Payer: Self-pay | Admitting: Internal Medicine

## 2021-05-19 DIAGNOSIS — I480 Paroxysmal atrial fibrillation: Secondary | ICD-10-CM

## 2021-05-19 MED ORDER — RIVAROXABAN 20 MG PO TABS: 20.0000 mg | ORAL_TABLET | Freq: Every day | ORAL | 1 refills | Status: DC

## 2021-05-19 NOTE — Telephone Encounter (Signed)
Spoke with pt and advised Dr Ladona Ridgel and his nurse are out of the office today but will forward message for review.  Pt verbalizes understanding and thanked Rn for the call.

## 2021-05-19 NOTE — Telephone Encounter (Signed)
Patient wanted to know if it is safe for her to take a 5 hour flight to travel with her heart condition.She is planning to leave on Wednesday

## 2021-05-19 NOTE — Telephone Encounter (Signed)
Prescription refill request for Xarelto received.  Indication: Afib  Last office visit:03/18/21 Ladona Ridgel)  Weight: 86.8kg Age: 75 Scr: 0.91 (05/14/21) CrCl:  72.28ml/min  Appropriate dose and refill sent to requested pharmacy.

## 2021-05-20 NOTE — Telephone Encounter (Signed)
Returned call to Pt.  Advised ok to take 5 hour flight.  Encouraged Pt to take standing or walking breaks as able.

## 2021-05-22 ENCOUNTER — Encounter: Payer: Self-pay | Admitting: *Deleted

## 2021-05-22 ENCOUNTER — Telehealth: Payer: Self-pay | Admitting: *Deleted

## 2021-05-22 ENCOUNTER — Ambulatory Visit: Payer: Self-pay | Admitting: General Surgery

## 2021-05-22 DIAGNOSIS — D0511 Intraductal carcinoma in situ of right breast: Secondary | ICD-10-CM

## 2021-05-22 NOTE — Telephone Encounter (Signed)
Spoke with patient to follow up from [facility] 11/2 and assess navigation needs. Patient states she will have sx here with Dr. Carolynne Edouard and see dr. Pamelia Hoit 1 week post op then have her XRT in [place] closer to home.  Informed I would let Dr.Toth's office know.  Encouraged her to call should any other questions or concerns arise.  Patient verbalized understanding.

## 2021-05-27 ENCOUNTER — Encounter: Payer: Self-pay | Admitting: *Deleted

## 2021-05-27 DIAGNOSIS — D0511 Intraductal carcinoma in situ of right breast: Secondary | ICD-10-CM

## 2021-05-28 ENCOUNTER — Telehealth: Payer: Self-pay | Admitting: Hematology and Oncology

## 2021-05-28 NOTE — Telephone Encounter (Signed)
Scheduled per sch msg. Called and spoke with patient. Confirmed appt  

## 2021-05-29 ENCOUNTER — Encounter: Payer: Self-pay | Admitting: General Practice

## 2021-05-29 NOTE — Progress Notes (Signed)
[facility] Spiritual Care Note  Followed up with Ms Edwyna Shell by phone. She reports coping well overall--"not thinking about diagnosis," and redirecting with favorite Scriptures or prayer when it does. She does report difficulty sleeping (three bladder wake-ups/night), which she plans to share with her doctor. Recruitment consultant peer mentor referral and plan to phone again in ca 2 weeks for pastoral check-in before surgery, both per her request.   Konrad Dolores, [facility] Pager [phone] Voicemail [phone]

## 2021-06-11 ENCOUNTER — Encounter: Payer: Self-pay | Admitting: General Practice

## 2021-06-11 NOTE — Progress Notes (Signed)
[facility] Spiritual Care Note  Nancy Kline is very welcoming of follow-up pastoral calls. She is enjoying time with family, practicing daily reading and prayer, and working on managing her possessions through a recent house purchase as ways of keeping her mind present in the moment. Will add her to the [facility] (hard copy) mailing list and send her an updated calendar so that she might request art kits through [employer] or join in other distance programming. We plan to follow up again by phone before her appointment on her birthday, [birth year]. We also reframed her birthday appointment as receiving a gift of health and healing, which helped it feel like a positive opportunity.   [address] [place], [place], [facility] Pager [phone] Voicemail [phone]

## 2021-06-11 NOTE — Progress Notes (Incomplete)
Patient Care Team: Shirline Frees, MD as PCP - General (Family Medicine) Evans Lance, MD as PCP - Electrophysiology (Cardiology) Jovita Kussmaul, MD as Consulting Physician (General Surgery) Nicholas Lose, MD as Consulting Physician (Hematology and Oncology) Kyung Rudd, MD as Consulting Physician (Radiation Oncology) Rockwell Germany, RN as Oncology Nurse Navigator Mauro Kaufmann, RN as Oncology Nurse Navigator  DIAGNOSIS: No diagnosis found.  SUMMARY OF ONCOLOGIC HISTORY: Oncology History  Ductal carcinoma in situ (DCIS) of right breast  05/01/2021 Initial Diagnosis   Screening mammogram: indeterminate grouped calcifications's and a 1 cm in the right breast.  Stereotactic biopsy: Low-grade DCIS with calcifications, ER/PR+(100%).   05/14/2021 Cancer Staging   Staging form: Breast, AJCC 8th Edition - Clinical stage from 05/14/2021: Stage 0 (cTis (DCIS), cN0, cM0, ER+, PR+, HER2: Not Assessed) - Signed by Nicholas Lose, MD on 05/14/2021 Nuclear grade: G1      CHIEF COMPLIANT: Follow-up of right breast DCIS  INTERVAL HISTORY: Nancy Kline is a 75 y.o. with above-mentioned history of right breast DCIS. She presents to the clinic today for follow-up.  ALLERGIES:  is allergic to atorvastatin, carisoprodol, celebrex [celecoxib], crestor  [rosuvastatin calcium], cyclobenzaprine, darvocet [propoxyphene n-acetaminophen], darvon  [propoxyphene], etodolac, flexeril [cyclobenzaprine hcl], gabapentin, hydrocodone-acetaminophen, pramipexole dihydrochloride, pregabalin, pregabalin, and ropinirole hcl.  MEDICATIONS:  Current Outpatient Medications  Medication Sig Dispense Refill   buPROPion (WELLBUTRIN XL) 150 MG 24 hr tablet Take 150 mg by mouth daily.     cholecalciferol (VITAMIN D) 1000 UNITS tablet Take 1,000 Units by mouth daily.     clonazePAM (KLONOPIN) 0.5 MG tablet Take 0.5 tablets by mouth as needed.     DULoxetine (CYMBALTA) 20 MG capsule Take 20 mg by mouth daily.      flecainide (TAMBOCOR) 50 MG tablet Take 1 tablet (50 mg total) by mouth 2 (two) times daily. 60 tablet 11   gabapentin (NEURONTIN) 300 MG capsule Take 300 mg by mouth daily.     glucosamine-chondroitin 500-400 MG tablet Take 1 tablet by mouth daily.     latanoprost (XALATAN) 0.005 % ophthalmic solution 1 drop at bedtime.     meclizine (ANTIVERT) 50 MG tablet Take 50 mg by mouth 3 (three) times daily as needed.     pantoprazole (PROTONIX) 40 MG tablet Take 1 tablet by mouth daily as needed.      predniSONE (DELTASONE) 10 MG tablet daily in the afternoon.     propranolol (INDERAL) 10 MG tablet Take one tablet by mouth daily.  May take up to an additional 3 tablets by mouth daily for breakthrough palpitations. 90 tablet 3   rivaroxaban (XARELTO) 20 MG TABS tablet Take 1 tablet (20 mg total) by mouth daily with supper. 90 tablet 1   traMADol (ULTRAM) 50 MG tablet as needed.     No current facility-administered medications for this visit.    PHYSICAL EXAMINATION: ECOG PERFORMANCE STATUS: {CHL ONC ECOG PS:[phone]}  There were no vitals filed for this visit. There were no vitals filed for this visit.  BREAST:*** No palpable masses or nodules in either right or left breasts. No palpable axillary supraclavicular or infraclavicular adenopathy no breast tenderness or nipple discharge. (exam performed in the presence of a chaperone)  LABORATORY DATA:  I have reviewed the data as listed CMP Latest Ref Rng & Units 05/14/2021 04/03/2020 09/22/2018  Glucose 70 - 99 mg/dL 109(H) 87 103(H)  BUN 8 - 23 mg/dL _0 Creatinine 0.44 - 1.00 mg/dL 0.91 1.03(H) 0.90  Sodium 135 - 145 mmol/L 137 137 140  Potassium 3.5 - 5.1 mmol/L 4.1 4.5 4.4  Chloride 98 - 111 mmol/L 101 99 100  CO2 22 - 32 mmol/L _0 Calcium 8.9 - 10.3 mg/dL 9.1 9.3 9.6  Total Protein 6.5 - 8.1 g/dL 7.0 - 6.9  Total Bilirubin 0.3 - 1.2 mg/dL 0.9 - 0.3  Alkaline Phos 38 - 126 U/L 62 - 65  AST 15 - 41 U/L 18 - 16  ALT 0 - 44 U/L  8 - 7    Lab Results  Component Value Date   WBC 4.4 05/14/2021   HGB 9.6 (L) 05/14/2021   HCT 27.9 (L) 05/14/2021   MCV 98.9 05/14/2021   PLT 167 05/14/2021   NEUTROABS 2.7 05/14/2021    ASSESSMENT & PLAN:  No problem-specific Assessment & Plan notes found for this encounter.    No orders of the defined types were placed in this encounter.  The patient has a good understanding of the overall plan. she agrees with it. she will call with any problems that may develop before the next visit here.  Total time spent: *** mins including face to face time and time spent for planning, charting and coordination of care  Rulon Eisenmenger, MD, MPH 06/11/2021  I, Thana Ates, am acting as scribe for Dr. Nicholas Lose.  {insert scribe attestation}

## 2021-06-12 ENCOUNTER — Inpatient Hospital Stay: Payer: Medicare PPO | Admitting: Hematology and Oncology

## 2021-06-12 ENCOUNTER — Telehealth: Payer: Self-pay | Admitting: *Deleted

## 2021-06-12 ENCOUNTER — Encounter: Payer: Self-pay | Admitting: *Deleted

## 2021-06-12 NOTE — Assessment & Plan Note (Deleted)
05/01/2021:Screening mammogram: indeterminate grouped calcifications's and a 1 cm in the right breast.  Stereotactic biopsy: Low-grade DCIS with calcifications, ER/PR+(100%).  Treatment plan: 1. Breast conserving surgery 2. +/- adjuvant radiation therapy 3. Followed by antiestrogen therapy with tamoxifen 5 years

## 2021-06-12 NOTE — Telephone Encounter (Signed)
Spoke with patient because I noticed she had an appt scheduled for today 12/1 at 130 with Dr. Pamelia Hoit. This was supposed to be a post op appt but the patient is not scheduled for sx until 12/16. R/S pt. To 12/30 at 12N.  Referral sent to [facility] for xrt because it's closer for her.

## 2021-06-12 NOTE — Telephone Encounter (Signed)
Spoke to [place] at [facility] regarding referral. Discussed pt will only have xrt there. She will call pt 1-2 days post op to get her scheduled to be seen by rad onc 2-3wks after sx.

## 2021-06-16 ENCOUNTER — Ambulatory Visit: Payer: Medicare PPO | Admitting: Internal Medicine

## 2021-06-20 ENCOUNTER — Telehealth: Payer: Self-pay | Admitting: *Deleted

## 2021-06-20 ENCOUNTER — Encounter (HOSPITAL_BASED_OUTPATIENT_CLINIC_OR_DEPARTMENT_OTHER): Payer: Self-pay | Admitting: General Surgery

## 2021-06-20 ENCOUNTER — Other Ambulatory Visit: Payer: Self-pay

## 2021-06-20 DIAGNOSIS — I700 Atherosclerosis of aorta: Secondary | ICD-10-CM

## 2021-06-20 NOTE — Telephone Encounter (Signed)
   Pre-operative Risk Assessment    Patient Name: Nancy Kline  DOB: March 21, 1946 MRN: [id number]      Request for Surgical Clearance   Procedure:   LUMPECTOMY  Date of Surgery: Clearance TBD                                 Surgeon:  DR. Chevis Pretty III Surgeon's Group or Practice Name:  [facility] Phone number:  [phone] Fax number:  [phone] ATTN: Brennan Bailey, CMA   Type of Clearance Requested: - Medical  - Pharmacy:  Hold Rivaroxaban (Xarelto)     Type of Anesthesia:   General    Additional requests/questions:   Elpidio Anis   06/20/2021, 4:58 PM

## 2021-06-20 NOTE — Telephone Encounter (Signed)
Covering preop. Will route to pharm then pt will need call. Last OV 03/18/21.

## 2021-06-20 NOTE — Progress Notes (Signed)
Left voicemail with Lajoyce Corners at [facility] office requesting to have the pts cards office address instructions for her Xarelto for upcoming procedure.

## 2021-06-23 DIAGNOSIS — I700 Atherosclerosis of aorta: Secondary | ICD-10-CM | POA: Insufficient documentation

## 2021-06-23 NOTE — Progress Notes (Signed)

## 2021-06-23 NOTE — Telephone Encounter (Signed)
Pt is returning call to Pre Op Team [phone]

## 2021-06-23 NOTE — Telephone Encounter (Signed)
Patient with diagnosis of afib on Xarelto for anticoagulation.    Procedure: lumpectomy Date of procedure: 06/27/21  CHA2DS2-VASc Score = 5  This indicates a 7.2% annual risk of stroke. The patient's score is based upon: CHF History: 0 HTN History: 1 Diabetes History: 0 Stroke History: 0 Vascular Disease History: 1 Age Score: 2 Gender Score: 1   CrCl 74mL/min using adjusted body weight Platelet count 167K  Per office protocol, patient can hold Xarelto for 2 days prior to procedure.

## 2021-06-23 NOTE — Telephone Encounter (Signed)
   Name: Nancy Kline  DOB: 1946/02/02  MRN: [id number]   Primary Cardiologist: None  Chart reviewed as part of pre-operative protocol coverage. Patient was contacted 06/23/2021 in reference to pre-operative risk assessment for pending surgery as outlined below.  Nancy Kline was last seen on 03/18/2021 by [name].  Since that day, Nancy Kline has done well without significant exertional chest pain or worsening dyspnea. Patient can achieve more than 4 METS of activity without issue.   Therefore, based on ACC/AHA guidelines, the patient would be at acceptable risk for the planned procedure without further cardiovascular testing.   Per our clinical pharmacist, patient may hold Xarelto for 2 days prior to the procedure and restart as soon as possible afterward at the surgeon's discretion based on the bleeding risk.   The patient was advised that if she develops new symptoms prior to surgery to contact our office to arrange for a follow-up visit, and she verbalized understanding.  I will route this recommendation to the requesting party via Epic fax function and remove from pre-op pool. Please call with questions.  [place], [place] 06/23/2021, 12:27 PM

## 2021-06-24 ENCOUNTER — Encounter: Payer: Self-pay | Admitting: General Practice

## 2021-06-24 NOTE — Progress Notes (Signed)
[facility] Spiritual Care Note  Returned call from Ms Trabucco, providing emotional support and blessing as she prepares for surgery on her birthday. Reframing the surgery as a birthday gift of health has been very helpful to her. Referred her questions about post-op needs to her nurse navigator. We plan to check in by phone again before or after the Christmas holiday.   [address] [place], [place], [facility] Pager [phone] Voicemail [phone]

## 2021-06-26 DIAGNOSIS — C50811 Malignant neoplasm of overlapping sites of right female breast: Secondary | ICD-10-CM | POA: Diagnosis not present

## 2021-06-27 ENCOUNTER — Ambulatory Visit (HOSPITAL_BASED_OUTPATIENT_CLINIC_OR_DEPARTMENT_OTHER): Payer: Medicare PPO | Admitting: Anesthesiology

## 2021-06-27 ENCOUNTER — Encounter (HOSPITAL_BASED_OUTPATIENT_CLINIC_OR_DEPARTMENT_OTHER): Payer: Self-pay | Admitting: General Surgery

## 2021-06-27 ENCOUNTER — Ambulatory Visit (HOSPITAL_BASED_OUTPATIENT_CLINIC_OR_DEPARTMENT_OTHER)
Admission: RE | Admit: 2021-06-27 | Discharge: 2021-06-27 | Disposition: A | Discharge status: Home / Self Care | Payer: Medicare PPO | Source: Ambulatory Visit | Attending: General Surgery | Admitting: General Surgery

## 2021-06-27 ENCOUNTER — Encounter (HOSPITAL_BASED_OUTPATIENT_CLINIC_OR_DEPARTMENT_OTHER)
Admission: RE | Disposition: A | Discharge status: Home / Self Care | Payer: Self-pay | Source: Ambulatory Visit | Attending: General Surgery

## 2021-06-27 ENCOUNTER — Other Ambulatory Visit: Payer: Self-pay

## 2021-06-27 DIAGNOSIS — N6011 Diffuse cystic mastopathy of right breast: Secondary | ICD-10-CM | POA: Diagnosis not present

## 2021-06-27 DIAGNOSIS — Z79899 Other long term (current) drug therapy: Secondary | ICD-10-CM | POA: Diagnosis not present

## 2021-06-27 DIAGNOSIS — D0511 Intraductal carcinoma in situ of right breast: Secondary | ICD-10-CM | POA: Diagnosis not present

## 2021-06-27 DIAGNOSIS — R921 Mammographic calcification found on diagnostic imaging of breast: Secondary | ICD-10-CM | POA: Diagnosis not present

## 2021-06-27 DIAGNOSIS — Z7901 Long term (current) use of anticoagulants: Secondary | ICD-10-CM | POA: Diagnosis not present

## 2021-06-27 DIAGNOSIS — R928 Other abnormal and inconclusive findings on diagnostic imaging of breast: Secondary | ICD-10-CM | POA: Diagnosis not present

## 2021-06-27 DIAGNOSIS — Z170 Estrogen receptor positive status [ER+]: Secondary | ICD-10-CM | POA: Diagnosis not present

## 2021-06-27 DIAGNOSIS — Z803 Family history of malignant neoplasm of breast: Secondary | ICD-10-CM | POA: Diagnosis not present

## 2021-06-27 DIAGNOSIS — I4891 Unspecified atrial fibrillation: Secondary | ICD-10-CM | POA: Diagnosis not present

## 2021-06-27 DIAGNOSIS — K219 Gastro-esophageal reflux disease without esophagitis: Secondary | ICD-10-CM | POA: Insufficient documentation

## 2021-06-27 DIAGNOSIS — I480 Paroxysmal atrial fibrillation: Secondary | ICD-10-CM | POA: Diagnosis not present

## 2021-06-27 HISTORY — DX: Cardiac arrhythmia, unspecified: I49.9

## 2021-06-27 HISTORY — PX: BREAST LUMPECTOMY WITH RADIOACTIVE SEED LOCALIZATION: SHX6424

## 2021-06-27 SURGERY — BREAST LUMPECTOMY WITH RADIOACTIVE SEED LOCALIZATION
Anesthesia: General | Site: Breast | Laterality: Right

## 2021-06-27 MED ORDER — ONDANSETRON HCL 4 MG/2ML IJ SOLN
INTRAMUSCULAR | Status: AC
  Filled 2021-06-27: qty 2

## 2021-06-27 MED ORDER — FENTANYL CITRATE (PF) 100 MCG/2ML IJ SOLN
INTRAMUSCULAR | Status: AC
  Filled 2021-06-27: qty 2

## 2021-06-27 MED ORDER — PROPOFOL 10 MG/ML IV BOLUS
INTRAVENOUS | Status: DC | PRN
  Administered 2021-06-27: 200 mg via INTRAVENOUS

## 2021-06-27 MED ORDER — CHLORHEXIDINE GLUCONATE CLOTH 2 % EX PADS: 6.0000 | MEDICATED_PAD | Freq: Once | CUTANEOUS | Status: DC

## 2021-06-27 MED ORDER — ACETAMINOPHEN 500 MG PO TABS
1000.0000 mg | ORAL_TABLET | Freq: Once | ORAL | Status: AC
  Administered 2021-06-27: 1000 mg via ORAL

## 2021-06-27 MED ORDER — LIDOCAINE HCL (CARDIAC) PF 100 MG/5ML IV SOSY
PREFILLED_SYRINGE | INTRAVENOUS | Status: DC | PRN
  Administered 2021-06-27: 60 mg via INTRAVENOUS

## 2021-06-27 MED ORDER — GLYCOPYRROLATE PF 0.2 MG/ML IJ SOSY
PREFILLED_SYRINGE | INTRAMUSCULAR | Status: AC
  Filled 2021-06-27: qty 1

## 2021-06-27 MED ORDER — BUPIVACAINE-EPINEPHRINE (PF) 0.25% -1:200000 IJ SOLN
INTRAMUSCULAR | Status: DC | PRN
  Administered 2021-06-27: 20 mL

## 2021-06-27 MED ORDER — GLYCOPYRROLATE 0.2 MG/ML IJ SOLN
INTRAMUSCULAR | Status: DC | PRN
  Administered 2021-06-27: .2 mg via INTRAVENOUS

## 2021-06-27 MED ORDER — CEFAZOLIN SODIUM-DEXTROSE 2-4 GM/100ML-% IV SOLN
INTRAVENOUS | Status: AC
  Filled 2021-06-27: qty 100

## 2021-06-27 MED ORDER — FENTANYL CITRATE (PF) 100 MCG/2ML IJ SOLN
INTRAMUSCULAR | Status: DC | PRN
  Administered 2021-06-27: 25 ug via INTRAVENOUS

## 2021-06-27 MED ORDER — PROPOFOL 10 MG/ML IV BOLUS
INTRAVENOUS | Status: AC
  Filled 2021-06-27: qty 20

## 2021-06-27 MED ORDER — LACTATED RINGERS IV SOLN: INTRAVENOUS | Status: DC

## 2021-06-27 MED ORDER — FENTANYL CITRATE (PF) 100 MCG/2ML IJ SOLN: 25.0000 ug | INTRAMUSCULAR | Status: DC | PRN

## 2021-06-27 MED ORDER — ACETAMINOPHEN 500 MG PO TABS
ORAL_TABLET | ORAL | Status: AC
  Filled 2021-06-27: qty 2

## 2021-06-27 MED ORDER — ONDANSETRON HCL 4 MG/2ML IJ SOLN: 4.0000 mg | Freq: Once | INTRAMUSCULAR | Status: DC | PRN

## 2021-06-27 MED ORDER — ONDANSETRON HCL 4 MG/2ML IJ SOLN
INTRAMUSCULAR | Status: DC | PRN
  Administered 2021-06-27: 4 mg via INTRAVENOUS

## 2021-06-27 MED ORDER — TRAMADOL HCL 50 MG PO TABS: 50.0000 mg | ORAL_TABLET | Freq: Four times a day (QID) | ORAL | 0 refills | Status: DC | PRN

## 2021-06-27 MED ORDER — EPHEDRINE SULFATE 50 MG/ML IJ SOLN
INTRAMUSCULAR | Status: DC | PRN
  Administered 2021-06-27: 10 mg via INTRAVENOUS
  Administered 2021-06-27: 5 mg via INTRAVENOUS
  Administered 2021-06-27: 10 mg via INTRAVENOUS

## 2021-06-27 MED ORDER — LIDOCAINE 2% (20 MG/ML) 5 ML SYRINGE
INTRAMUSCULAR | Status: AC
  Filled 2021-06-27: qty 5

## 2021-06-27 MED ORDER — DEXAMETHASONE SODIUM PHOSPHATE 10 MG/ML IJ SOLN
INTRAMUSCULAR | Status: DC | PRN
  Administered 2021-06-27: 5 mg via INTRAVENOUS

## 2021-06-27 MED ORDER — CEFAZOLIN SODIUM-DEXTROSE 2-4 GM/100ML-% IV SOLN
2.0000 g | INTRAVENOUS | Status: AC
  Administered 2021-06-27: 2 g via INTRAVENOUS

## 2021-06-27 MED ORDER — DEXAMETHASONE SODIUM PHOSPHATE 10 MG/ML IJ SOLN
INTRAMUSCULAR | Status: AC
  Filled 2021-06-27: qty 1

## 2021-06-27 MED ORDER — EPHEDRINE 5 MG/ML INJ
INTRAVENOUS | Status: AC
  Filled 2021-06-27: qty 5

## 2021-06-27 SURGICAL SUPPLY — 42 items
APPLIER CLIP 9.375 MED OPEN (MISCELLANEOUS)
BLADE SURG 15 STRL LF DISP TIS (BLADE) ×1 IMPLANT
BLADE SURG 15 STRL SS (BLADE) ×3
CANISTER SUC SOCK COL 7IN (MISCELLANEOUS) ×3 IMPLANT
CANISTER SUCT 1200ML W/VALVE (MISCELLANEOUS) ×3 IMPLANT
CHLORAPREP W/TINT 26 (MISCELLANEOUS) ×3 IMPLANT
CLIP APPLIE 9.375 MED OPEN (MISCELLANEOUS) IMPLANT
COVER BACK TABLE 60X90IN (DRAPES) ×3 IMPLANT
COVER MAYO STAND STRL (DRAPES) ×3 IMPLANT
COVER PROBE W GEL 5X96 (DRAPES) ×3 IMPLANT
DECANTER SPIKE VIAL GLASS SM (MISCELLANEOUS) IMPLANT
DERMABOND ADVANCED (GAUZE/BANDAGES/DRESSINGS) ×2
DERMABOND ADVANCED .7 DNX12 (GAUZE/BANDAGES/DRESSINGS) ×1 IMPLANT
DRAPE LAPAROSCOPIC ABDOMINAL (DRAPES) ×3 IMPLANT
DRAPE UTILITY XL STRL (DRAPES) ×3 IMPLANT
ELECT COATED BLADE 2.86 ST (ELECTRODE) ×3 IMPLANT
ELECT REM PT RETURN 9FT ADLT (ELECTROSURGICAL) ×3
ELECTRODE REM PT RTRN 9FT ADLT (ELECTROSURGICAL) ×1 IMPLANT
GLOVE SURG ENC MOIS LTX SZ7.5 (GLOVE) ×6 IMPLANT
GLOVE SURG POLYISO LF SZ7 (GLOVE) ×2 IMPLANT
GLOVE SURG UNDER POLY LF SZ7 (GLOVE) ×2 IMPLANT
GOWN STRL REUS W/ TWL LRG LVL3 (GOWN DISPOSABLE) ×2 IMPLANT
GOWN STRL REUS W/TWL LRG LVL3 (GOWN DISPOSABLE) ×6
ILLUMINATOR WAVEGUIDE N/F (MISCELLANEOUS) IMPLANT
KIT MARKER MARGIN INK (KITS) ×3 IMPLANT
LIGHT WAVEGUIDE WIDE FLAT (MISCELLANEOUS) IMPLANT
NDL HYPO 25X1 1.5 SAFETY (NEEDLE) IMPLANT
NEEDLE HYPO 25X1 1.5 SAFETY (NEEDLE) ×3 IMPLANT
NS IRRIG 1000ML POUR BTL (IV SOLUTION) IMPLANT
PACK BASIN DAY SURGERY FS (CUSTOM PROCEDURE TRAY) ×3 IMPLANT
PENCIL SMOKE EVACUATOR (MISCELLANEOUS) ×3 IMPLANT
SLEEVE SCD COMPRESS KNEE MED (STOCKING) ×3 IMPLANT
SPONGE T-LAP 18X18 ~~LOC~~+RFID (SPONGE) ×3 IMPLANT
SUT MON AB 4-0 PC3 18 (SUTURE) ×3 IMPLANT
SUT SILK 2 0 SH (SUTURE) IMPLANT
SUT VICRYL 3-0 CR8 SH (SUTURE) ×3 IMPLANT
SYR CONTROL 10ML LL (SYRINGE) ×2 IMPLANT
TOWEL GREEN STERILE FF (TOWEL DISPOSABLE) ×3 IMPLANT
TRAY FAXITRON CT DISP (TRAY / TRAY PROCEDURE) ×3 IMPLANT
TUBE CONNECTING 20'X1/4 (TUBING) ×1
TUBE CONNECTING 20X1/4 (TUBING) ×2 IMPLANT
YANKAUER SUCT BULB TIP NO VENT (SUCTIONS) IMPLANT

## 2021-06-27 NOTE — H&P (Signed)
PROVIDER: Landry Corporal, MD  MRN: [id number] DOB: Aug 05, 1945 Subjective  Chief Complaint: Breast Cancer   History of Present Illness: Nancy Kline is a 75 y.o. female who is seen today as an office consultation at the request of Dr. Pasty Arch for evaluation of Breast Cancer .   We are asked to see the patient in consultation by Dr. Lindi Adie to evaluate her for a new right breast cancer. The patient is a 75 year old black female who recently went for a routine screening mammogram. At that time she was found to have a 1 cm area of calcifications in the retroareolar area. This was biopsied and came back as low-grade ductal carcinoma in situ that was ER and PR positive. She does have atrial fibrillation and takes Xarelto. She does not smoke.  Review of Systems: A complete review of systems was obtained from the patient. I have reviewed this information and discussed as appropriate with the patient. See HPI as well for other ROS.  ROS   Medical History: Past Medical History:  Diagnosis Date   Anemia  Date unknown   Anxiousness  Date unknown   Arthritis  Date unknown   Bleeds easily (CMS-HCC)  Date unknown   Bruises easily  Date unknown   Cancer (CMS-HCC)  Date unknown   Cough  Date unknown   GERD (gastroesophageal reflux disease)  Date unknown   Glaucoma  Date unknown   Hyperlipidemia  Date unknown   Nervous  Date unknown   Patient Active Problem List  Diagnosis   Anemia   Arthritis   Cancer (CMS-HCC)   GERD (gastroesophageal reflux disease)   Glaucoma (increased eye pressure)   Hyperlipidemia   Ductal carcinoma in situ (DCIS) of right breast   Past Surgical History:  Procedure Laterality Date   Atrial flutter ablation    No Known Allergies  Current Outpatient Medications on File Prior to Visit  Medication Sig Dispense Refill   DULoxetine (CYMBALTA) 20 MG DR capsule Take 20 mg by mouth once daily   gabapentin (NEURONTIN) 300 MG capsule gabapentin 300 mg capsule    rivaroxaban (XARELTO) 20 mg tablet Xarelto 20 mg tablet   cholecalciferol (VITAMIN D3) 1000 unit tablet Take by mouth   clonazePAM (KLONOPIN) 0.5 MG tablet clonazepam 0.5 mg tablet   difluprednate (DUREZOL) 0.05 % ophthalmic emulsion Durezol 0.05 % eye drops   DULoxetine (CYMBALTA) 30 MG DR capsule duloxetine 30 mg capsule,delayed release   predniSONE (DELTASONE) 10 MG tablet Take 6 tabs today, then decrease by one tab daily - 5 tabs on day 2, 4 tabs on day 3, 3 tabs on day 4, 2 tabs on day 5, 1 tab on day 6. 21 tablet 0   No current facility-administered medications on file prior to visit.   Family History  Problem Relation Age of Onset   High blood pressure (Hypertension) Mother   Breast cancer Sister    Social History   Tobacco Use  Smoking Status Never Smoker  Smokeless Tobacco Never Used    Social History   Socioeconomic History   Marital status: Widowed  Tobacco Use   Smoking status: Never Smoker   Smokeless tobacco: Never Used  Vaping Use   Vaping Use: Never used  Substance and Sexual Activity   Alcohol use: Never   Drug use: Never   Sexual activity: Defer   Objective:  There were no vitals filed for this visit.  There is no height or weight on file to calculate BMI.  Physical Exam Vitals  reviewed.  Constitutional:  General: She is not in acute distress. Appearance: Normal appearance.  HENT:  Head: Normocephalic and atraumatic.  Right Ear: External ear normal.  Left Ear: External ear normal.  Nose: Nose normal.  Mouth/Throat:  Mouth: Mucous membranes are moist.  Pharynx: Oropharynx is clear.  Eyes:  General: No scleral icterus. Extraocular Movements: Extraocular movements intact.  Conjunctiva/sclera: Conjunctivae normal.  Pupils: Pupils are equal, round, and reactive to light.  Cardiovascular:  Rate and Rhythm: Normal rate and regular rhythm.  Pulses: Normal pulses.  Heart sounds: Normal heart sounds.  Pulmonary:  Effort: Pulmonary effort is  normal. No respiratory distress.  Breath sounds: Normal breath sounds.  Abdominal:  General: Bowel sounds are normal.  Palpations: Abdomen is soft.  Tenderness: There is no abdominal tenderness.  Musculoskeletal:  General: No swelling, tenderness or deformity. Normal range of motion.  Cervical back: Normal range of motion and neck supple.  Skin: General: Skin is warm and dry.  Coloration: Skin is not jaundiced.  Neurological:  General: No focal deficit present.  Mental Status: She is alert and oriented to person, place, and time.  Psychiatric:  Mood and Affect: Mood normal.  Behavior: Behavior normal.     Breast: There is a moderate size palpable hematoma centrally in the right breast. There is no palpable mass in the left breast. There is no palpable axillary, supraclavicular, or cervical lymphadenopathy.  Labs, Imaging and Diagnostic Testing:  Assessment and Plan:  Diagnoses and all orders for this visit:  Ductal carcinoma in situ (DCIS) of right breast    The patient appears to have a small area of DCIS in the retroareolar right breast. I have discussed with her in detail the different options for treatment. She recently moved to the [place] area and is deciding between having treatment locally there versus continuing with treatment here. She seems to be favoring breast conservation which I feel is very reasonable. I have discussed with her in detail the risks and benefits of the operation as well as some of the technical aspects including the use of a radioactive seed for localization and she understands. She will call us once she has made a decision. She will also meet with medical and radiation oncology to discuss adjuvant therapy.

## 2021-06-27 NOTE — Anesthesia Preprocedure Evaluation (Addendum)
Anesthesia Evaluation  Patient identified by MRN, date of birth, ID band Patient awake  General Assessment Comment:RIGHT BREAST DUCTAL CARCINOMA IN SITU  Reviewed: Allergy & Precautions, NPO status , Patient's Chart, lab work & pertinent test results  Airway Mallampati: III  TM Distance: >3 FB Neck ROM: Full    Dental  (+) Teeth Intact, Dental Advisory Given, Caps,    Pulmonary neg pulmonary ROS,    Pulmonary exam normal breath sounds clear to auscultation       Cardiovascular Normal cardiovascular exam+ dysrhythmias Atrial Fibrillation + Valvular Problems/Murmurs MVP  Rhythm:Regular Rate:Normal     Neuro/Psych  Headaches,  Neuromuscular disease    GI/Hepatic Neg liver ROS, GERD  Medicated,  Endo/Other  negative endocrine ROS  Renal/GU negative Renal ROS Bladder dysfunction      Musculoskeletal negative musculoskeletal ROS (+)   Abdominal   Peds  Hematology  (+) Blood dyscrasia (Xarelto), ,   Anesthesia Other Findings Day of surgery medications reviewed with the patient.  Reproductive/Obstetrics                            Anesthesia Physical Anesthesia Plan  ASA: 3  Anesthesia Plan: General   Post-op Pain Management: Tylenol PO (pre-op)   Induction: Intravenous  PONV Risk Score and Plan: 3 and Dexamethasone, Ondansetron and Treatment may vary due to age or medical condition  Airway Management Planned: LMA  Additional Equipment:   Intra-op Plan:   Post-operative Plan: Extubation in OR  Informed Consent: I have reviewed the patients History and Physical, chart, labs and discussed the procedure including the risks, benefits and alternatives for the proposed anesthesia with the patient or authorized representative who has indicated his/her understanding and acceptance.     Dental advisory given  Plan Discussed with: CRNA  Anesthesia Plan Comments:         Anesthesia  Quick Evaluation

## 2021-06-27 NOTE — Op Note (Signed)
06/27/2021  11:11 AM  PATIENT:  Nancy Kline  75 y.o. female  PRE-OPERATIVE DIAGNOSIS:  RIGHT BREAST DUCTAL CARCINOMA IN SITU  POST-OPERATIVE DIAGNOSIS:  RIGHT BREAST DUCTAL CARCINOMA IN SITU  PROCEDURE:  Procedure(s): RIGHT BREAST LUMPECTOMY WITH RADIOACTIVE SEED LOCALIZATION (Right)  SURGEON:  Surgeon(s) and Role:    * Griselda Miner, MD - Primary  PHYSICIAN ASSISTANT:   ASSISTANTS: none   ANESTHESIA:   local and general  EBL:  minimal   BLOOD ADMINISTERED:none  DRAINS: none   LOCAL MEDICATIONS USED:  MARCAINE     SPECIMEN:  Source of Specimen:  right breast tissue  DISPOSITION OF SPECIMEN:  PATHOLOGY  COUNTS:  YES  TOURNIQUET:  * No tourniquets in log *  DICTATION: .Dragon Dictation  After informed consent was obtained the patient was brought to the operating room and placed in the supine position on the operating table.  After adequate induction of general anesthesia the patient's right breast was prepped with ChloraPrep, allowed to dry, and draped in usual sterile manner.  An appropriate timeout was performed.  Previously an I-125 seed was placed in the subareolar right breast to mark an area of ductal carcinoma in situ.  The neoprobe was set to I-125 in the area of radioactivity was readily identified beneath the lateral subareolar area.  The area around this was infiltrated with quarter percent Marcaine.  A curvilinear incision was made along the outer aspect of the areola of the right breast with a 15 blade knife.  The incision was carried through the skin and subcutaneous tissue sharply with the electrocautery.  Dissection was then carried out superficially beneath the nipple and areola sharply with the electrocautery.  Once the dissection was well beyond the area of the radioactivity I then removed a circular portion of breast tissue sharply with the electrocautery around the radioactive seed while checking the area of radioactivity frequently.  Once the specimen  was removed it was oriented with the appropriate paint colors.  A specimen radiograph was obtained that showed the clip and seed to be near the center of the specimen.  The specimen was then sent to pathology for further evaluation.  Hemostasis was achieved using the Bovie electrocautery.  The wound was irrigated with saline and infiltrated with more quarter percent Marcaine.  The deep layer of the wound was then closed with layers of interrupted 3-0 Vicryl stitches.  The skin was then closed with interrupted 4-0 Monocryl subcuticular stitches.  Dermabond dressings were applied.  The patient tolerated the procedure well.  At the end of the case all needle sponge and instrument counts were correct.  The patient was then awakened and taken to recovery in stable condition.  PLAN OF CARE: Discharge to home after PACU  PATIENT DISPOSITION:  PACU - hemodynamically stable.   Delay start of Pharmacological VTE agent (>24hrs) due to surgical blood loss or risk of bleeding: not applicable

## 2021-06-27 NOTE — Discharge Instructions (Signed)
°  Post Anesthesia Home Care Instructions  Activity: Get plenty of rest for the remainder of the day. A responsible individual must stay with you for 24 hours following the procedure.  For the next 24 hours, DO NOT: -Drive a car -Advertising copywriter -Drink alcoholic beverages -Take any medication unless instructed by your physician -Make any legal decisions or sign important papers.  Meals: Start with liquid foods such as gelatin or soup. Progress to regular foods as tolerated. Avoid greasy, spicy, heavy foods. If nausea and/or vomiting occur, drink only clear liquids until the nausea and/or vomiting subsides. Call your physician if vomiting continues.  Special Instructions/Symptoms: Your throat may feel dry or sore from the anesthesia or the breathing tube placed in your throat during surgery. If this causes discomfort, gargle with warm salt water. The discomfort should disappear within 24 hours.  If you had a scopolamine patch placed behind your ear for the management of post- operative nausea and/or vomiting:  1. The medication in the patch is effective for 72 hours, after which it should be removed.  Wrap patch in a tissue and discard in the trash. Wash hands thoroughly with soap and water. 2. You may remove the patch earlier than 72 hours if you experience unpleasant side effects which may include dry mouth, dizziness or visual disturbances. 3. Avoid touching the patch. Wash your hands with soap and water after contact with the patch.     No Tylenol until 4:11 pm

## 2021-06-27 NOTE — Interval H&P Note (Signed)
History and Physical Interval Note:  06/27/2021 10:02 AM  Nancy Kline  has presented today for surgery, with the diagnosis of RIGHT BREAST DUCTAL CARCINOMA IN SITU.  The various methods of treatment have been discussed with the patient and family. After consideration of risks, benefits and other options for treatment, the patient has consented to  Procedure(s): RIGHT BREAST LUMPECTOMY WITH RADIOACTIVE SEED LOCALIZATION (Right) as a surgical intervention.  The patient's history has been reviewed, patient examined, no change in status, stable for surgery.  I have reviewed the patient's chart and labs.  Questions were answered to the patient's satisfaction.     Chevis Pretty III

## 2021-06-27 NOTE — Anesthesia Procedure Notes (Signed)
Procedure Name: LMA Insertion Date/Time: 06/27/2021 10:29 AM Performed by: Lauralyn Primes, CRNA Pre-anesthesia Checklist: Patient identified, Emergency Drugs available, Suction available and Patient being monitored Patient Re-evaluated:Patient Re-evaluated prior to induction Oxygen Delivery Method: Circle system utilized Preoxygenation: Pre-oxygenation with 100% oxygen Induction Type: IV induction Ventilation: Mask ventilation without difficulty LMA: LMA inserted LMA Size: 4.0 Number of attempts: 1 Airway Equipment and Method: Bite block Placement Confirmation: positive ETCO2 Tube secured with: Tape Dental Injury: Teeth and Oropharynx as per pre-operative assessment

## 2021-06-27 NOTE — Anesthesia Postprocedure Evaluation (Signed)
Anesthesia Post Note  Patient: Nancy Kline  Procedure(s) Performed: RIGHT BREAST LUMPECTOMY WITH RADIOACTIVE SEED LOCALIZATION (Right: Breast)     Patient location during evaluation: PACU Anesthesia Type: General Level of consciousness: awake and alert Pain management: pain level controlled Vital Signs Assessment: post-procedure vital signs reviewed and stable Respiratory status: spontaneous breathing, nonlabored ventilation and respiratory function stable Cardiovascular status: blood pressure returned to baseline and stable Postop Assessment: no apparent nausea or vomiting Anesthetic complications: no   No notable events documented.  Last Vitals:  Vitals:   06/27/21 1215 06/27/21 1244  BP:  (!) 162/63  Pulse: 63 68  Resp: 12 16  Temp:  36.6 C  SpO2: 98% 100%    Last Pain:  Vitals:   06/27/21 1244  TempSrc:   PainSc: 0-No pain                 Lowella Curb

## 2021-06-27 NOTE — Transfer of Care (Signed)
Immediate Anesthesia Transfer of Care Note  Patient: Nancy Kline  Procedure(s) Performed: RIGHT BREAST LUMPECTOMY WITH RADIOACTIVE SEED LOCALIZATION (Right: Breast)  Patient Location: PACU  Anesthesia Type:General  Level of Consciousness: awake, alert  and oriented  Airway & Oxygen Therapy: Patient Spontanous Breathing and Patient connected to face mask oxygen  Post-op Assessment: Report given to RN and Post -op Vital signs reviewed and stable  Post vital signs: Reviewed and stable  Last Vitals:  Vitals Value Taken Time  BP    Temp    Pulse 69 06/27/21 1116  Resp    SpO2 100 % 06/27/21 1116  Vitals shown include unvalidated device data.  Last Pain:  Vitals:   06/27/21 1004  TempSrc: Oral  PainSc: 0-No pain      Patients Stated Pain Goal: 4 (06/27/21 1004)  Complications: No notable events documented.

## 2021-06-30 ENCOUNTER — Encounter (HOSPITAL_BASED_OUTPATIENT_CLINIC_OR_DEPARTMENT_OTHER): Payer: Self-pay | Admitting: General Surgery

## 2021-06-30 ENCOUNTER — Encounter: Payer: Self-pay | Admitting: General Practice

## 2021-06-30 LAB — SURGICAL PATHOLOGY

## 2021-06-30 NOTE — Progress Notes (Signed)
[facility] Spiritual Care Note  Made post-op pastoral check-in call. Ms Pinkus was in good spirits, noting that she's "only had to take a couple of Tylenol and no Tramadol" despite her "low pain threshold," which has been relieving and comforting. Her daughter planned some special birthday meals for her with friends, which was very meaningful. Ms enza shone chaplain as listener and conversation partner, and we plan to follow up by phone next month.   [address] [place], [place], [facility] Pager [phone] Voicemail [phone]

## 2021-07-01 ENCOUNTER — Encounter: Payer: Self-pay | Admitting: *Deleted

## 2021-07-02 DIAGNOSIS — C50911 Malignant neoplasm of unspecified site of right female breast: Principal | ICD-10-CM

## 2021-07-11 ENCOUNTER — Ambulatory Visit: Payer: Medicare PPO | Admitting: Hematology and Oncology

## 2021-07-14 NOTE — Progress Notes (Signed)
Patient Care Team: Shirline Frees, MD as PCP - General (Family Medicine) Evans Lance, MD as PCP - Electrophysiology (Cardiology) Jovita Kussmaul, MD as Consulting Physician (General Surgery) Nicholas Lose, MD as Consulting Physician (Hematology and Oncology) Kyung Rudd, MD as Consulting Physician (Radiation Oncology) Rockwell Germany, RN as Oncology Nurse Navigator Mauro Kaufmann, RN as Oncology Nurse Navigator  DIAGNOSIS:    ICD-10-CM   1. Ductal carcinoma in situ (DCIS) of right breast  D05.11       SUMMARY OF ONCOLOGIC HISTORY: Oncology History  Ductal carcinoma in situ (DCIS) of right breast  05/01/2021 Initial Diagnosis   Screening mammogram: indeterminate grouped calcifications's and a 1 cm in the right breast.  Stereotactic biopsy: Low-grade DCIS with calcifications, ER/PR+(100%).   05/14/2021 Cancer Staging   Staging form: Breast, AJCC 8th Edition - Clinical stage from 05/14/2021: Stage 0 (cTis (DCIS), cN0, cM0, ER+, PR+, HER2: Not Assessed) - Signed by Nicholas Lose, MD on 05/14/2021 Nuclear grade: G1      CHIEF COMPLIANT: Follow-up of right breast cancer  INTERVAL HISTORY: Nancy Kline is a 76 y.o. with above-mentioned history of right breast cancer. Right lumpectomy on 06/27/2021 showed fibrocystic changes with calcifications. She presents to the clinic today for follow-up.   ALLERGIES:  is allergic to atorvastatin, carisoprodol, celebrex [celecoxib], crestor  [rosuvastatin calcium], cyclobenzaprine, darvocet [propoxyphene n-acetaminophen], darvon  [propoxyphene], etodolac, flexeril [cyclobenzaprine hcl], hydrocodone-acetaminophen, pramipexole dihydrochloride, pregabalin, pregabalin, and ropinirole hcl.  MEDICATIONS:  Current Outpatient Medications  Medication Sig Dispense Refill   buPROPion (WELLBUTRIN XL) 150 MG 24 hr tablet Take 150 mg by mouth daily.     cholecalciferol (VITAMIN D) 1000 UNITS tablet Take 1,000 Units by mouth daily.     clonazePAM  (KLONOPIN) 0.5 MG tablet Take 0.5 tablets by mouth as needed.     DULoxetine (CYMBALTA) 20 MG capsule Take 20 mg by mouth daily.     flecainide (TAMBOCOR) 50 MG tablet Take 1 tablet (50 mg total) by mouth 2 (two) times daily. 60 tablet 11   gabapentin (NEURONTIN) 300 MG capsule Take 300 mg by mouth daily.     glucosamine-chondroitin 500-400 MG tablet Take 1 tablet by mouth daily.     latanoprost (XALATAN) 0.005 % ophthalmic solution 1 drop at bedtime.     meclizine (ANTIVERT) 50 MG tablet Take 50 mg by mouth 3 (three) times daily as needed.     pantoprazole (PROTONIX) 40 MG tablet Take 1 tablet by mouth daily as needed.      predniSONE (DELTASONE) 10 MG tablet daily in the afternoon.     propranolol (INDERAL) 10 MG tablet Take one tablet by mouth daily.  May take up to an additional 3 tablets by mouth daily for breakthrough palpitations. 90 tablet 3   rivaroxaban (XARELTO) 20 MG TABS tablet Take 1 tablet (20 mg total) by mouth daily with supper. 90 tablet 1   traMADol (ULTRAM) 50 MG tablet as needed.     traMADol (ULTRAM) 50 MG tablet Take 1 tablet (50 mg total) by mouth every 6 (six) hours as needed. 20 tablet 0   No current facility-administered medications for this visit.    PHYSICAL EXAMINATION: ECOG PERFORMANCE STATUS: 1 - Symptomatic but completely ambulatory  Vitals:   07/15/21 1339  BP: (!) 143/44  Pulse: 82  Resp: 16  Temp: 97.8 F (36.6 C)  SpO2: 100%   Filed Weights   07/15/21 1339  Weight: 185 lb 12.8 oz (84.3 kg)  LABORATORY DATA:  I have reviewed the data as listed CMP Latest Ref Rng & Units 05/14/2021 04/03/2020 09/22/2018  Glucose 70 - 99 mg/dL 109(H) 87 103(H)  BUN 8 - 23 mg/dL 11 12 14   Creatinine 0.44 - 1.00 mg/dL 0.91 1.03(H) 0.90  Sodium 135 - 145 mmol/L 137 137 140  Potassium 3.5 - 5.1 mmol/L 4.1 4.5 4.4  Chloride 98 - 111 mmol/L 101 99 100  CO2 22 - 32 mmol/L 28 27 26   Calcium 8.9 - 10.3 mg/dL 9.1 9.3 9.6  Total Protein 6.5 - 8.1 g/dL 7.0 - 6.9   Total Bilirubin 0.3 - 1.2 mg/dL 0.9 - 0.3  Alkaline Phos 38 - 126 U/L 62 - 65  AST 15 - 41 U/L 18 - 16  ALT 0 - 44 U/L 8 - 7    Lab Results  Component Value Date   WBC 4.4 05/14/2021   HGB 9.6 (L) 05/14/2021   HCT 27.9 (L) 05/14/2021   MCV 98.9 05/14/2021   PLT 167 05/14/2021   NEUTROABS 2.7 05/14/2021    ASSESSMENT & PLAN:  Ductal carcinoma in situ (DCIS) of right breast 05/01/2021:Screening mammogram: indeterminate grouped calcifications's and a 1 cm in the right breast.  Stereotactic biopsy: Low-grade DCIS with calcifications, ER/PR+(100%). 06/27/2021: Right lumpectomy: Fibrocystic changes, no residual DCIS  Treatment plan: 1. +/- adjuvant radiation therapy (patient has an appointment tomorrow at [facility]) 2. Followed by antiestrogen therapy with tamoxifen 5 years (started 07/15/2018 2310 mg daily)  We discussed the risks and benefits of tamoxifen. These include but not limited to insomnia, hot flashes, mood changes, vaginal dryness, and weight gain. Although rare, serious side effects including endometrial cancer, risk of blood clots were also discussed. We strongly believe that the benefits far outweigh the risks. Patient understands these risks and consented to starting treatment. Planned treatment duration is 5 years.  Return to clinic in 3 months for survivorship care plan visit    No orders of the defined types were placed in this encounter.  The patient has a good understanding of the overall plan. she agrees with it. she will call with any problems that may develop before the next visit here.  Total time spent: 20 mins including face to face time and time spent for planning, charting and coordination of care  Rulon Eisenmenger, MD, MPH 07/15/2021  I, Thana Ates, am acting as scribe for Dr. Nicholas Lose.  I have reviewed the above documentation for accuracy and completeness, and I agree with the above.

## 2021-07-15 ENCOUNTER — Inpatient Hospital Stay: Payer: Medicare PPO | Attending: Hematology and Oncology | Admitting: Hematology and Oncology

## 2021-07-15 ENCOUNTER — Other Ambulatory Visit: Payer: Self-pay

## 2021-07-15 DIAGNOSIS — D0511 Intraductal carcinoma in situ of right breast: Secondary | ICD-10-CM | POA: Diagnosis not present

## 2021-07-15 DIAGNOSIS — Z170 Estrogen receptor positive status [ER+]: Secondary | ICD-10-CM | POA: Diagnosis not present

## 2021-07-15 MED ORDER — MAGNESIUM 250 MG PO TABS: 1.0000 | ORAL_TABLET | Freq: Every day | ORAL | 0 refills | Status: DC

## 2021-07-15 MED ORDER — TAMOXIFEN CITRATE 10 MG PO TABS: 10.0000 mg | ORAL_TABLET | Freq: Every day | ORAL | 3 refills | Status: DC

## 2021-07-15 NOTE — Assessment & Plan Note (Signed)
05/01/2021:Screening mammogram: indeterminate grouped calcifications's and a 1 cm in the right breast.  Stereotactic biopsy: Low-grade DCIS with calcifications, ER/PR+(100%). 06/27/2021: Right lumpectomy: Fibrocystic changes, no residual DCIS  Treatment plan: 1. +/- adjuvant radiation therapy 2. Followed by antiestrogen therapy with tamoxifen 5 years  Return to clinic in 3 months for survivorship care plan visit

## 2021-07-16 ENCOUNTER — Ambulatory Visit: Admit: 2021-07-16 | Payer: MEDICARE

## 2021-07-16 ENCOUNTER — Ambulatory Visit: Admit: 2021-07-16 | Payer: MEDICARE | Attending: Radiation Oncology | Primary: Radiation Oncology

## 2021-07-16 DIAGNOSIS — C50911 Malignant neoplasm of unspecified site of right female breast: Principal | ICD-10-CM

## 2021-07-18 ENCOUNTER — Encounter: Payer: Self-pay | Admitting: *Deleted

## 2021-07-18 ENCOUNTER — Telehealth: Payer: Self-pay | Admitting: Internal Medicine

## 2021-07-18 NOTE — Telephone Encounter (Signed)
Patient is requesting to speak with Dr. Lubertha Basque nurse. She states it is very important and she will discuss in further detail when her call is confirmed.

## 2021-07-18 NOTE — Telephone Encounter (Signed)
Tamoxifen interacts with her duloxetine. Duloxetine is a moderate CYP2D6 inhibitor and decreases efficacy of tamoxifen. May need to reach out to PCP to potentially change SNRI to different therapy.  Tamoxifen also interacts with her flecainide and can increase QTc. Most recent EKG shows normal EKG but would recommend rechecking EKG a few weeks after starting tamoxifen.   Tamoxifen increases thromboembolic risk. Pt already takes Xarelto for afib which should help decrease the chance of this.

## 2021-07-18 NOTE — Telephone Encounter (Signed)
Spoke with the patient who states that she had breast cancer and just found out yesterday that it has all been removed and she will not need any radiation. She is going to have to start taking tamoxifen and would like to know if that interacts with any of her cardiac medications. Doesn't appear to be any interactions but will route to PharmD to confirm.

## 2021-07-18 NOTE — Telephone Encounter (Signed)
Spoke with the patient and gave her advisement from PharmD. She is going to talk to her PCP about switching her duloxetine. She has not started taking tamoxifen yet. I have advised her that once she starts to give [place] a call so that we can get her set up to come in for an EKG a few weeks later. Patient verbalized understanding.

## 2021-07-22 ENCOUNTER — Encounter: Payer: Self-pay | Admitting: *Deleted

## 2021-07-22 DIAGNOSIS — D0511 Intraductal carcinoma in situ of right breast: Secondary | ICD-10-CM

## 2021-08-13 ENCOUNTER — Ambulatory Visit: Admit: 2021-08-13 | Payer: MEDICARE

## 2021-08-14 ENCOUNTER — Other Ambulatory Visit: Payer: Self-pay | Admitting: *Deleted

## 2021-08-14 MED ORDER — FLECAINIDE ACETATE 50 MG PO TABS: 50.0000 mg | ORAL_TABLET | Freq: Two times a day (BID) | ORAL | 11 refills | Status: DC

## 2021-09-10 ENCOUNTER — Ambulatory Visit: Admit: 2021-09-10 | Payer: MEDICARE

## 2021-09-17 ENCOUNTER — Encounter (HOSPITAL_COMMUNITY): Payer: Self-pay

## 2021-10-11 ENCOUNTER — Ambulatory Visit: Admit: 2021-10-11 | Payer: MEDICARE

## 2021-10-13 ENCOUNTER — Inpatient Hospital Stay: Payer: Medicare PPO | Admitting: Adult Health

## 2021-10-13 ENCOUNTER — Telehealth: Payer: Self-pay | Admitting: *Deleted

## 2021-10-20 ENCOUNTER — Other Ambulatory Visit: Payer: Self-pay

## 2021-10-20 ENCOUNTER — Other Ambulatory Visit: Payer: Self-pay | Admitting: Internal Medicine

## 2021-10-20 MED ORDER — PROPRANOLOL HCL 10 MG PO TABS: ORAL_TABLET | ORAL | 1 refills | Status: DC

## 2021-10-27 DIAGNOSIS — H5203 Hypermetropia, bilateral: Secondary | ICD-10-CM | POA: Diagnosis not present

## 2021-10-30 DIAGNOSIS — L603 Nail dystrophy: Secondary | ICD-10-CM | POA: Diagnosis not present

## 2021-10-30 DIAGNOSIS — I739 Peripheral vascular disease, unspecified: Secondary | ICD-10-CM | POA: Diagnosis not present

## 2021-10-30 DIAGNOSIS — B351 Tinea unguium: Secondary | ICD-10-CM | POA: Diagnosis not present

## 2021-11-03 ENCOUNTER — Other Ambulatory Visit: Payer: Self-pay | Admitting: Internal Medicine

## 2021-11-03 DIAGNOSIS — I480 Paroxysmal atrial fibrillation: Secondary | ICD-10-CM

## 2021-11-03 NOTE — Telephone Encounter (Signed)
Xarelto 20mg  refill request received. Pt is 76 years old, weight- 84.3kg, Crea-0.91 on 05/14/2021, last seen by Dr. 13/08/2020 on 03/18/2021, Diagnosis-Afib, CrCl-68.16ml/min; Dose is appropriate based on dosing criteria. Will send in refill to requested pharmacy.    ?

## 2021-11-24 ENCOUNTER — Ambulatory Visit: Admit: 2021-11-24 | Discharge: 2021-11-25 | Payer: MEDICARE

## 2021-11-24 DIAGNOSIS — M170 Bilateral primary osteoarthritis of knee: Principal | ICD-10-CM

## 2021-11-24 DIAGNOSIS — D0511 Intraductal carcinoma in situ of right breast: Principal | ICD-10-CM

## 2021-11-24 DIAGNOSIS — R109 Unspecified abdominal pain: Principal | ICD-10-CM

## 2021-11-24 DIAGNOSIS — G603 Idiopathic progressive neuropathy: Principal | ICD-10-CM

## 2021-11-24 DIAGNOSIS — R0989 Other specified symptoms and signs involving the circulatory and respiratory systems: Principal | ICD-10-CM

## 2021-11-24 DIAGNOSIS — D649 Anemia, unspecified: Principal | ICD-10-CM

## 2021-11-24 DIAGNOSIS — I480 Paroxysmal atrial fibrillation: Principal | ICD-10-CM

## 2021-11-24 DIAGNOSIS — E785 Hyperlipidemia, unspecified: Principal | ICD-10-CM

## 2021-11-24 MED ORDER — GABAPENTIN 300 MG CAPSULE
ORAL_CAPSULE | Freq: Three times a day (TID) | ORAL | 0 refills | 90 days | Status: CP
Start: 2021-11-24 — End: ?

## 2021-11-27 ENCOUNTER — Inpatient Hospital Stay: Payer: Medicare PPO | Attending: Adult Health | Admitting: Adult Health

## 2021-11-27 ENCOUNTER — Other Ambulatory Visit: Payer: Self-pay

## 2021-11-27 VITALS — BP 148/82 | HR 77 | Temp 97.5°F | Resp 18 | Ht 67.0 in | Wt 187.7 lb

## 2021-11-27 DIAGNOSIS — N6310 Unspecified lump in the right breast, unspecified quadrant: Secondary | ICD-10-CM | POA: Insufficient documentation

## 2021-11-27 DIAGNOSIS — Z79899 Other long term (current) drug therapy: Secondary | ICD-10-CM | POA: Insufficient documentation

## 2021-11-27 DIAGNOSIS — D0511 Intraductal carcinoma in situ of right breast: Secondary | ICD-10-CM | POA: Insufficient documentation

## 2021-11-27 NOTE — Progress Notes (Signed)
Called pt to advise orders for (R) breast US and MM have been faxed to [facility] with fax confirmation. Provided pt with [facility] phone number so she can call and schedule. Pt knows to call with any concerns.

## 2021-11-27 NOTE — Progress Notes (Signed)
SURVIVORSHIP VISIT:   BRIEF ONCOLOGIC HISTORY:  Oncology History  Ductal carcinoma in situ (DCIS) of right breast  05/01/2021 Initial Diagnosis   Screening mammogram: indeterminate grouped calcifications's and a 1 cm in Nancy right breast.  Stereotactic biopsy: Low-grade DCIS with calcifications, ER/PR+(100%).   05/14/2021 Cancer Staging   Staging form: Breast, AJCC 8th Edition - Clinical stage from 05/14/2021: Stage 0 (cTis (DCIS), cN0, cM0, ER+, PR+, HER2: Not Assessed) - Signed by Nicholas Lose, MD on 05/14/2021 Nuclear grade: G1    07/2021 -  Anti-estrogen oral therapy   Tamoxifen daily     INTERVAL HISTORY:  Nancy Kline to review her survivorship care plan detailing her treatment course for breast cancer, as well as monitoring long-term side effects of that treatment, education regarding health maintenance, screening, and overall wellness and health promotion.     Overall, Nancy Kline reports feeling quite well.  She has many questions today about her overall health and a slight decrease in her blood pressure.  She has been taking tamoxifen with fatigue and weight concerns.   REVIEW OF SYSTEMS:  Review of Systems  Constitutional:  Positive for fatigue. Negative for appetite change, chills, fever and unexpected weight change.  HENT:   Negative for hearing loss, lump/mass and trouble swallowing.   Eyes:  Negative for eye problems and icterus.  Respiratory:  Negative for chest tightness, cough and shortness of breath.   Cardiovascular:  Negative for chest pain, leg swelling and palpitations.  Gastrointestinal:  Negative for abdominal distention, abdominal pain, constipation, diarrhea, nausea and vomiting.  Endocrine: Negative for hot flashes.  Genitourinary:  Negative for difficulty urinating.   Musculoskeletal:  Negative for arthralgias.  Skin:  Negative for itching and rash.  Neurological:  Negative for dizziness, extremity weakness, headaches and numbness.  Hematological:  Negative for  adenopathy. Does not bruise/bleed easily.  Psychiatric/Behavioral:  Negative for depression. Nancy Kline is not nervous/anxious.   Breast: Denies any new nodularity, masses, tenderness, nipple changes, or nipple discharge.      ONCOLOGY TREATMENT TEAM:  1. Surgeon:  Dr. Marlou Starks at [facility] 2. Medical Oncologist: Dr. Lindi Adie  3. Radiation Oncologist: Dr. Lisbeth Renshaw    PAST MEDICAL/SURGICAL HISTORY:  Past Medical History:  Diagnosis Date   Atrial fibrillation (HCC)    Carpal tunnel syndrome    Chronic back pain    Dysrhythmia    GERD (gastroesophageal reflux disease)    Hyperlipidemia    Mitral valve prolapse    Overactive bladder    Palpitations    Peripheral neuropathy    RLS (restless legs syndrome)    Sciatica    Vertigo    Past Surgical History:  Procedure Laterality Date   ATRIAL FLUTTER ABLATION  04/2011   ablation   BREAST LUMPECTOMY WITH RADIOACTIVE SEED LOCALIZATION Right 06/27/2021   Procedure: RIGHT BREAST LUMPECTOMY WITH RADIOACTIVE SEED LOCALIZATION;  Surgeon: Jovita Kussmaul, MD;  Location: [facility];  Service: General;  Laterality: Right;   CATARACT EXTRACTION       ALLERGIES:  Allergies  Allergen Reactions   Atorvastatin Other (See Comments)   Carisoprodol Other (See Comments)   Celebrex [Celecoxib] Other (See Comments)    hallucinations   Crestor  [Rosuvastatin Calcium] Other (See Comments)   Cyclobenzaprine Other (See Comments)   Darvocet [Propoxyphene N-Acetaminophen] Other (See Comments)    hallucinations   Darvon  [Propoxyphene] Other (See Comments)   Etodolac Other (See Comments)   Flexeril [Cyclobenzaprine Hcl] Other (See Comments)  hallucinations   Hydrocodone-Acetaminophen Other (See Comments)   Pramipexole Dihydrochloride Other (See Comments)   Pregabalin Other (See Comments)    hallucinations   Pregabalin Other (See Comments)   Ropinirole Hcl     Other reaction(s): dizziness/nausea     CURRENT  MEDICATIONS:  Outpatient Encounter Medications as of 11/27/2021  Medication Sig   cholecalciferol (VITAMIN D) 1000 UNITS tablet Take 1,000 Units by mouth daily.   clonazePAM (KLONOPIN) 0.5 MG tablet Take 0.5 tablets by mouth as needed.   DULoxetine (CYMBALTA) 20 MG capsule Take 20 mg by mouth daily.   flecainide (TAMBOCOR) 50 MG tablet Take 1 tablet (50 mg total) by mouth 2 (two) times daily.   gabapentin (NEURONTIN) 300 MG capsule Take 300 mg by mouth 3 (three) times daily.   latanoprost (XALATAN) 0.005 % ophthalmic solution 1 drop at bedtime.   meclizine (ANTIVERT) 50 MG tablet Take 50 mg by mouth 3 (three) times daily as needed.   pantoprazole (PROTONIX) 40 MG tablet Take 1 tablet by mouth daily as needed.    propranolol (INDERAL) 10 MG tablet Take one tablet by mouth daily.  May take up to an additional 3 tablets by mouth daily for breakthrough palpitations.   tamoxifen (NOLVADEX) 10 MG tablet Take 1 tablet (10 mg total) by mouth daily.   XARELTO 20 MG TABS tablet TAKE 1 TABLET(20 MG) BY MOUTH DAILY WITH SUPPER   glucosamine-chondroitin 500-400 MG tablet Take 1 tablet by mouth daily. (Kline not taking: Reported on 11/27/2021)   Magnesium 250 MG TABS Take 1 tablet (250 mg total) by mouth daily. (Kline not taking: Reported on 11/27/2021)   [DISCONTINUED] gabapentin (NEURONTIN) 300 MG capsule Take by mouth. (Kline not taking: Reported on 11/27/2021)   No facility-administered encounter medications on file as of 11/27/2021.     ONCOLOGIC FAMILY HISTORY:  Family History  Problem Relation Age of Onset   Hypertension Mother    Dementia Mother    Breast cancer Sister 71   Breast cancer Sister 22     GENETIC COUNSELING/TESTING: declined  SOCIAL HISTORY:  Social History   Socioeconomic History   Marital status: Married    Spouse name: Building control surveyor Sr.    Number of children: 2   Years of education: BS   Highest education level: Not on file  Occupational History   Occupation:  Retired    Fish farm manager: RETIRED  Tobacco Use   Smoking status: Never   Smokeless tobacco: Never  Vaping Use   Vaping Use: Never used  Substance and Sexual Activity   Alcohol use: No   Drug use: No   Sexual activity: Not on file  Other Topics Concern   Not on file  Social History Narrative   [name]   Caffeine Use: 1 cup of coffee monthly.    Social Determinants of Health   Financial Resource Strain: Not on file  Food Insecurity: Not on file  Transportation Needs: Not on file  Physical Activity: Not on file  Stress: Not on file  Social Connections: Not on file  Intimate Partner Violence: Not on file     OBSERVATIONS/OBJECTIVE:  BP (!) 137/44 (BP Location: Right Arm, Kline Position: Sitting) Comment: 2nd BP 135/45 MD aware  Pulse 77   Temp (!) 97.5 F (36.4 C) (Tympanic)   Resp 18   Ht 5' 7"  (1.702 m)   Wt 187 lb 11.2 oz (85.1 kg)   SpO2 100%   BMI 29.40 kg/m  GENERAL:  Kline is a well appearing female in no acute distress HEENT:  Sclerae anicteric.  Oropharynx clear and moist. No ulcerations or evidence of oropharyngeal candidiasis. Neck is supple.  NODES:  No cervical, supraclavicular, or axillary lymphadenopathy palpated.  BREAST EXAM: Right breast status postlumpectomy, small soft breast nodule about 0.5cm well circumscribed and movable. left breast is benign LUNGS:  Clear to auscultation bilaterally.  No wheezes or rhonchi. HEART:  Regular rate and rhythm. No murmur appreciated. ABDOMEN:  Soft, nontender.  Positive, normoactive bowel sounds. No organomegaly palpated. MSK:  No focal spinal tenderness to palpation. Full range of motion bilaterally in Nancy upper extremities. EXTREMITIES:  No peripheral edema.   SKIN:  Clear with no obvious rashes or skin changes. No nail dyscrasia. NEURO:  Nonfocal. Well oriented.  Appropriate affect.   LABORATORY DATA:  None for this visit.  DIAGNOSTIC IMAGING:  None for this visit.       ASSESSMENT AND PLAN:  Nancy Kline is a pleasant 76 y.o. 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  [facility] for our initial meeting and routine follow-up post-completion of treatment for breast cancer.    1. Stage 0 right breast cancer:  Nancy Kline is continuing to recover from definitive treatment for breast cancer. She will follow-up with her medical oncologist, Dr. Lindi Adie in 6 months with history and physical exam per surveillance protocol.  She will continue her anti-estrogen therapy with Tamoxifen. Thus far, she is tolerating Nancy Tamoixfen well, with minimal side effects. She was instructed to make Dr. Lindi Adie or myself aware if she begins to experience any worsening side effects of Nancy medication and I could see her back in clinic to help manage those side effects, as needed. Her mammogram is due 04/2022; orders placed today. Today, a comprehensive survivorship care plan and treatment summary was reviewed with Nancy Kline today detailing her breast cancer diagnosis, treatment course, potential late/long-term effects of treatment, appropriate follow-up care with recommendations for Nancy future, and Kline education resources.  A copy of this summary, along with a letter will be sent to Nancy Kline's primary care provider via mail/fax/In Basket message after today's visit.    2. Small breast nodule: will evaluate with diagnostic right breast mammogram and ultrasound.  3. Bone health:  She was given education on specific activities to promote bone health.  4. Cancer screening:  Due to Nancy Kline's history and her age, she should receive screening for skin cancers, colon cancer.  Nancy information and recommendations are listed on Nancy Kline's comprehensive care plan/treatment summary and were reviewed in detail with Nancy Kline.    5.  Health maintenance and wellness promotion: Nancy Kline was encouraged to consume 5-7 servings of fruits and vegetables per day. We reviewed Nancy "Nutrition Rainbow" handout.  She was also encouraged to engage in moderate to vigorous exercise for 30 minutes per day most days of Nancy week. We discussed Nancy [employer] fitness program, which is designed for cancer survivors to help them become more physically fit after cancer treatments.  She was instructed to limit her alcohol consumption and continue to abstain from tobacco use.     6. Support services/counseling: It is not uncommon for this period of Nancy Kline's cancer care trajectory to be one of many emotions and stressors. She was given information regarding our available services and encouraged to contact me with any questions  or for help enrolling in any of our support group/programs.    Follow up instructions:    -Return to cancer center in 6 months for f/u with Dr. Lindi Adie  -Mammogram due in 11/2021 (to eval right breast), and bilaterally in 04/2022 -Follow up with surgery in 1year -She is welcome to return back to [facility] at any time; no additional follow-up needed at this time.  -Consider referral back to survivorship as a long-term survivor for continued surveillance  Nancy Kline was provided an opportunity to ask questions and all were answered. Nancy Kline agreed with Nancy plan and demonstrated an understanding of Nancy instructions.   Total encounter time:60 minutes*in face-to-face visit time, chart review, lab review, care coordination, order entry, and documentation of Nancy encounter time.    Wilber Bihari, NP 11/27/21 12:26 PM Medical Oncology and Hematology [facility] [place], [place] [address] Tel. [phone]    Fax. [phone]  *Total Encounter Time as defined by Nancy [employer] for Medicare and Medicaid Services includes, in addition to Nancy face-to-face time of a Kline visit  (documented in Nancy note above) non-face-to-face time: obtaining and reviewing outside history, ordering and reviewing medications, tests or procedures, care coordination (communications with other health care professionals or caregivers) and documentation in Nancy medical record.

## 2021-11-29 ENCOUNTER — Encounter: Payer: Self-pay | Admitting: Adult Health

## 2021-12-04 ENCOUNTER — Encounter: Payer: Self-pay | Admitting: Adult Health

## 2021-12-04 DIAGNOSIS — N6310 Unspecified lump in the right breast, unspecified quadrant: Secondary | ICD-10-CM | POA: Diagnosis not present

## 2021-12-04 DIAGNOSIS — Z853 Personal history of malignant neoplasm of breast: Secondary | ICD-10-CM | POA: Diagnosis not present

## 2021-12-04 DIAGNOSIS — R928 Other abnormal and inconclusive findings on diagnostic imaging of breast: Secondary | ICD-10-CM | POA: Diagnosis not present

## 2021-12-15 ENCOUNTER — Ambulatory Visit: Admit: 2021-12-15 | Discharge: 2021-12-16 | Payer: MEDICARE

## 2021-12-15 DIAGNOSIS — R0989 Other specified symptoms and signs involving the circulatory and respiratory systems: Secondary | ICD-10-CM | POA: Diagnosis not present

## 2021-12-16 ENCOUNTER — Other Ambulatory Visit: Admit: 2021-12-16 | Discharge: 2021-12-17 | Payer: MEDICARE

## 2021-12-16 DIAGNOSIS — I480 Paroxysmal atrial fibrillation: Principal | ICD-10-CM

## 2021-12-16 DIAGNOSIS — D649 Anemia, unspecified: Principal | ICD-10-CM

## 2021-12-16 DIAGNOSIS — R109 Unspecified abdominal pain: Principal | ICD-10-CM

## 2021-12-16 DIAGNOSIS — E785 Hyperlipidemia, unspecified: Principal | ICD-10-CM

## 2021-12-18 DIAGNOSIS — Z79899 Other long term (current) drug therapy: Secondary | ICD-10-CM | POA: Diagnosis not present

## 2021-12-18 DIAGNOSIS — H401132 Primary open-angle glaucoma, bilateral, moderate stage: Secondary | ICD-10-CM | POA: Diagnosis not present

## 2021-12-23 ENCOUNTER — Ambulatory Visit: Admit: 2021-12-23 | Discharge: 2021-12-24 | Payer: MEDICARE

## 2021-12-23 ENCOUNTER — Other Ambulatory Visit: Payer: Self-pay | Admitting: Internal Medicine

## 2021-12-23 DIAGNOSIS — M545 Acute left-sided low back pain without sciatica: Principal | ICD-10-CM

## 2021-12-23 DIAGNOSIS — E785 Hyperlipidemia, unspecified: Principal | ICD-10-CM

## 2021-12-23 DIAGNOSIS — R109 Unspecified abdominal pain: Principal | ICD-10-CM

## 2021-12-23 DIAGNOSIS — M79605 Pain in left leg: Principal | ICD-10-CM

## 2021-12-23 DIAGNOSIS — M79604 Pain in right leg: Principal | ICD-10-CM

## 2021-12-23 DIAGNOSIS — R0989 Other specified symptoms and signs involving the circulatory and respiratory systems: Principal | ICD-10-CM

## 2021-12-23 DIAGNOSIS — D649 Anemia, unspecified: Principal | ICD-10-CM

## 2021-12-23 MED ORDER — TIZANIDINE 2 MG TABLET
ORAL_TABLET | Freq: Every evening | ORAL | 0 refills | 30 days | Status: CP | PRN
Start: 2021-12-23 — End: ?

## 2021-12-23 MED ORDER — ZETIA 10 MG TABLET
ORAL_TABLET | Freq: Every evening | ORAL | 3 refills | 30 days | Status: CP
Start: 2021-12-23 — End: 2022-04-22

## 2021-12-24 MED ORDER — ASPIRIN 81 MG TABLET,DELAYED RELEASE
ORAL_TABLET | ORAL | 5 refills | 70 days | Status: CP
Start: 2021-12-24 — End: ?

## 2022-01-06 DIAGNOSIS — E785 Hyperlipidemia, unspecified: Principal | ICD-10-CM

## 2022-01-06 MED ORDER — EZETIMIBE 10 MG TABLET
ORAL_TABLET | Freq: Every evening | ORAL | 3 refills | 30 days | Status: CP
Start: 2022-01-06 — End: 2022-05-06

## 2022-01-23 ENCOUNTER — Ambulatory Visit: Admit: 2022-01-23 | Discharge: 2022-01-24 | Payer: MEDICARE

## 2022-01-23 DIAGNOSIS — E7849 Other hyperlipidemia: Principal | ICD-10-CM

## 2022-01-23 DIAGNOSIS — R0602 Shortness of breath: Principal | ICD-10-CM

## 2022-01-23 DIAGNOSIS — I483 Typical atrial flutter: Principal | ICD-10-CM

## 2022-01-23 DIAGNOSIS — I480 Paroxysmal atrial fibrillation: Principal | ICD-10-CM

## 2022-01-23 DIAGNOSIS — I6523 Occlusion and stenosis of bilateral carotid arteries: Principal | ICD-10-CM

## 2022-01-23 DIAGNOSIS — R0989 Other specified symptoms and signs involving the circulatory and respiratory systems: Principal | ICD-10-CM

## 2022-01-23 DIAGNOSIS — M79604 Pain in right leg: Secondary | ICD-10-CM | POA: Diagnosis not present

## 2022-01-23 DIAGNOSIS — M79605 Pain in left leg: Secondary | ICD-10-CM | POA: Diagnosis not present

## 2022-01-23 MED ORDER — EVOLOCUMAB 140 MG/ML SUBCUTANEOUS PEN INJECTOR
SUBCUTANEOUS | 11 refills | 0.00000 days | Status: CP
Start: 2022-01-23 — End: 2022-01-23

## 2022-01-26 NOTE — Unmapped (Addendum)
LMTCB- To discuss results and recommendations.

## 2022-01-27 NOTE — Unmapped (Signed)
[facility] Specialty Medication Onboarding    Specialty Medication: REPATHA SURECLICK 140 mg/mL Pnij (evolocumab)  Prior Authorization: Approved   Financial Assistance: No - copay card or grant not available  Final Copay/Day Supply: $40 / 28    Insurance Restrictions: None     Notes to Pharmacist:     The triage team has completed the benefits investigation and has determined that the patient is able to fill this medication at [facility]. Please contact the patient to complete the onboarding or follow up with the prescribing physician as needed.

## 2022-01-27 NOTE — Unmapped (Signed)
2nd attempt to contact patient, LMTCB.

## 2022-01-28 NOTE — Unmapped (Signed)
Third attempt to reach patient by phone to advise of results/results note without success. Message left that results would be mailed and are viewable via MyChart. Requested she contact the office with any questions. See additional documentation in Telephone encounter note 01/26/22

## 2022-01-28 NOTE — Unmapped (Unsigned)
Specialty Medication(s): Repatha    Beverly Cooper has been dis-enrolled from the [facility] specialty pharmacy services due to a pharmacy change. The patient is now filling at [facility] .    Additional information provided to the patient: only sent to [facility] for PA approval    Camillo Flaming  [facility] Specialty Pharmacist thumb  The injection may take up to 15 seconds. You will know the injection is complete when the medication window turns yellow. You may also hear a second click.  Remove the pen from your skin and discard the pen in a sharps container.  If there is blood at the injection site, press a cotton ball or gauze to the site. Do not rub the injection site.    Adherence/Missed dose instructions: Administer a missed dose within 7 days and resume your normal schedule.  If it has been more than 7 days and you inject every 2 weeks, skip the missed dose and resume your normal schedule..     Goals of Therapy     Lower cholesterol, prevention of cardiovascular events in patients with established cardiovascular disease    Side Effects & Monitoring Parameters   Flu-like symptoms  Signs of a common cold  Back pain  Injection site irritation  Nose or throat irritation    The following side effects should be reported to the provider:  Signs of an allergic reaction  Signs of high blood sugar (confusion, drowsiness, increase thirst/hunger/urination, fast breathing, flushing)      Contraindications, Warnings, & Precautions     Latex (the packaging of Repatha may contain natural rubber)    Drug/Food Interactions     Medication list reviewed in Epic. The patient was instructed to inform the care team before taking any new medications or supplements. No drug interactions identified.     Storage, Handling Precautions, & Disposal   Repatha should be stored in the refrigerator. If necessary, Repatha may be kept at room temperature for no more than 30 days.  Place used devices in a sharps container for disposal.      Current Medications (including OTC/herbals), Comorbidities and Allergies     Current Outpatient Medications   Medication Sig Dispense Refill    aspirin (ECOTRIN) 81 MG tablet Take 1 tablet (81 mg total) by mouth 3 (three) times a week. On Monday, Wednesday, Friday 30 tablet 5    cholecalciferol, vitamin D3-50 mcg, 2,000 unit,, 50 mcg (2,000 unit) cap Continuous.      clonazePAM (KLONOPIN) 0.5 MG tablet clonazepam 0.5 mg tablet   TAKE 1 TABLET AT BEDTIME ONCE A DAY IF NEEDED FOR SLEEP OR ANXIETY BY MOUTH (30 DAY SUPPLY) 30 DAYS      DULoxetine (CYMBALTA) 30 MG capsule Take 1 capsule (30 mg total) by mouth daily.      evolocumab 140 mg/mL PnIj Inject the contents of one pen (140 mg) under the skin every fourteen (14) days. 2 mL 11    ezetimibe (ZETIA) 10 mg tablet Take 1 tablet (10 mg total) by mouth at bedtime. 30 tablet 3    flecainide (TAMBOCOR) 50 MG tablet Take 1 tablet (50 mg total) by mouth Two (2) times a day.      gabapentin (NEURONTIN) 300 MG capsule Take 1 capsule (300 mg total) by mouth Three (3) times a day. 270 capsule 0    latanoprost (XALATAN) 0.005 % ophthalmic solution Administer 1 drop to both eyes nightly.      meclizine (ANTIVERT) 25  mg tablet meclizine 25 mg tablet   TAKE 1 TABLET BY MOUTH 3 TIMES A DAY DIZZINESS FOR 10 DAYS      multivit-min36/iron/folic acid (GERITOL COMPLETE ORAL) Take by mouth.      oxybutynin (DITROPAN) 5 MG tablet Take 1 tablet (5 mg total) by mouth two (2) times a day as needed (overactive bladder).      pantoprazole (PROTONIX) 40 MG tablet Take 1 tablet (40 mg total) by mouth daily.      propranoloL (INDERAL) 10 MG tablet propranolol 10 mg tablet   TAKE 1 TABLET BY MOUTH 4 TIMES A DAY AS NEEDED FOR HEART PALPITATIONS      tamoxifen (NOLVADEX) 10 MG tablet Take 1 tablet (10 mg total) by mouth daily.      tiZANidine (ZANAFLEX) 2 MG tablet Take 1 tablet (2 mg total) by mouth nightly as needed (muscle spasms). 30 tablet 0    UNABLE TO FIND Med Name: Prevagen      XARELTO 20 mg tablet Take 1 tablet (20 mg total) by mouth daily with evening meal.       No current facility-administered medications for this visit.       Allergies   Allergen Reactions    Carisoprodol Other (See Comments)     disoriented    Cyclobenzaprine Other (See Comments)     Disoriented, groggy    Naproxen Other (See Comments) disoriented      Pramipexole Other (See Comments)     insomnia    Propoxyphene N-Acetaminophen Other (See Comments)     disoriented      Gabapentin     Atorvastatin Muscle Pain    Celecoxib Other (See Comments)     disoriented    Etodolac Other (See Comments)     disoriented      Hydrocodone-Acetaminophen Nausea And Vomiting and Other (See Comments)     disoriented      Neomycin-Polymyxin Dizziness     Denies allergic reaction    Pregabalin Other (See Comments)     disoriented        Ropinirole Hcl Nausea Only and Dizziness    Rosuvastatin Calcium Muscle Pain       Patient Active Problem List   Diagnosis    Ductal carcinoma in situ (DCIS) of right breast    Flank pain    Paroxysmal atrial fibrillation (CMS-HCC)    Idiopathic progressive neuropathy    Bruit of left carotid artery    Primary osteoarthritis of both knees    Chronic anemia    Other hyperlipidemia    Acute left-sided low back pain without sciatica    Leg pain, bilateral    Typical atrial flutter (CMS-HCC)    Bilateral carotid artery stenosis       Reviewed and up to date in Epic.    Appropriateness of Therapy     Acute infections noted within Epic:  No active infections  Patient reported infection: {Blank single:19197::None,***- patient reported to provider,***- pharmacy reported to provider}    Is medication and dose appropriate based on diagnosis and infection status? {Blank single:19197::Yes,No - evidence provided by prescriber in *** note}    Prescription has been clinically reviewed: Yes      Baseline Quality of Life Assessment      How many days over the past month did your hyperlipidemia  keep you from your normal activities? For example, brushing your teeth or getting up in the morning. {Blank:19197::***,0,Patient declined to [employer]  Medication Assistance provided: Prior Authorization    Anticipated copay of $40 (28 days) reviewed with patient. Verified delivery address.    Delivery Information Scheduled delivery date: ***    Expected start date: ***    Medication will be delivered via Same Day Courier to the {Blank:19197::prescription,temporary} address in Epic [place].  This shipment will not require a signature.      Explained the services we provide at [facility] and that each month we would call to set up refills.  Stressed importance of returning phone calls so that we could ensure they receive their medications in time each month.  Informed patient that we should be setting up refills 7-10 days prior to when they will run out of medication.  A pharmacist will reach out to perform a clinical assessment periodically.  Informed patient that a welcome packet, containing information about our pharmacy and other support services, a Notice of Privacy Practices, and a drug information handout will be sent.      The patient or caregiver noted above participated in the development of this care plan and knows that they can request review of or adjustments to the care plan at any time.      Patient or caregiver verbalized understanding of the above information as well as how to contact the pharmacy at [phone] option 4 with any questions/concerns.  The pharmacy is open Monday through Friday 8:30am-4:30pm.  A pharmacist is available 24/7 via pager to answer any clinical questions they may have.    Patient Specific Needs     Does the patient have any physical, cognitive, or cultural barriers? {Blank single:19197::No,Yes - ***}    Does the patient have adequate living arrangements? (i.e. the ability to store and take their medication appropriately) {Blank single:19197::Yes,No - ***}    Did you identify any home environmental safety or security hazards? {Blank single:19197::No,Yes - ***}    Patient prefers to have medications discussed with  {Blank single:19197::Patient,Family Member,Caregiver,Other}     Is the patient or caregiver able to read and understand education materials at a high school level or above? {Blank single:19197::No,Yes}    Patient's primary language is  {Blank single:19197::English,Spanish,***}     Is the patient high risk? {sschighriskpts:78327}    SOCIAL DETERMINANTS OF HEALTH     At the [facility], we have learned that life circumstances - like trouble affording food, housing, utilities, or transportation can affect the health of many of our patients.   That is why we wanted to ask: are you currently experiencing any life circumstances that are negatively impacting your health and/or quality of life? {YES/NO/PATIENTDECLINED:93004}    Social Determinants of Health     Financial Resource Strain: Low Risk     Difficulty of Paying Living Expenses: Not hard at all   Internet Connectivity: Not on file   Food Insecurity: No Food Insecurity    Worried About Programme researcher, broadcasting/film/video in the Last Year: Never true    Barista in the Last Year: Never true   Tobacco Use: Low Risk     Smoking Tobacco Use: Never    Smokeless Tobacco Use: Never    Passive Exposure: Not on file   Housing/Utilities: Low Risk     Within the past 12 months, have you ever stayed: outside, in a car, in a tent, in an overnight shelter, or temporarily in someone else's home (i.e. couch-surfing)?: No    Are you worried about losing your housing?:  No    Within the past 12 months, have you been unable to get utilities (heat, electricity) when it was really needed?: No   Alcohol Use: Not At Risk    How often do you have a drink containing alcohol?: Never    How many drinks containing alcohol do you have on a typical day when you are drinking?: 1 - 2    How often do you have 5 or more drinks on one occasion?: Never   Transportation Needs: No Transportation Needs    Lack of Transportation (Medical): No    Lack of Transportation (Non-Medical): No   Substance Use: Low Risk     Taken prescription drugs for non-medical reasons: Never    Taken illegal drugs: Never    Patient indicated they have taken drugs in the past year for non-medical reasons: Yes, [positive answer(s)]: Not on file   Health Literacy: Low Risk     : Never   Physical Activity: Inactive    Days of Exercise per Week: 0 days    Minutes of Exercise per Session: 0 min   Interpersonal Safety: Not on file   Stress: No Stress Concern Present    Feeling of Stress : Not at all   Intimate Partner Violence: Not At Risk    Fear of Current or Ex-Partner: No    Emotionally Abused: No    Physically Abused: No    Sexually Abused: No   Depression: Not at risk    PHQ-2 Score: 0   Social Connections: Moderately Integrated    Frequency of Communication with Friends and Family: More than three times a week    Frequency of Social Gatherings with Friends and Family: More than three times a week    Attends Religious Services: More than 4 times per year    Active Member of [employer] or Organizations: Yes    Attends Banker Meetings: More than 4 times per year    Marital Status: Widowed       Would you be willing to receive help with any of the needs that you have identified today? {Yes/No/Not applicable:93005}       Camillo Flaming  [facility] Specialty Pharmacist

## 2022-01-30 NOTE — Unmapped (Signed)
Patient requested that I speak with her daughter, Towanda Octave, who is a Teacher, early years/pre, to explain further the way the test works and the pretest instructions. I went into more detail about the test with Glee, so she can reassure her mom. Glee verbalized understanding and will be attending the appointment with her mom.

## 2022-01-30 NOTE — Unmapped (Signed)
Spoke with patient and given reminder of nuclear stress test appointment and to review instructions for their procedure as per below:     Your stress test is scheduled for: Monday 02/02/22 at 0815 at [facility].     PATIENT INSTRUCTIONS     Do not take the following medications 24 hours prior to your test: none due to afib  No caffeine 12 hours before your test.  Do not eat or drink anything other than water 4 hours prior to appointment time. Please make sure you drink plenty of water.   Appointments scheduled at 11a.m or after may have a very light breakfast before 7a.m. (small bowl of cereal, toast, or oatmeal). Don???t forget, no caffeine.  Please hold all diabetic medications including insulin the morning of the test. Bring your meds with you so you can take them when appropriate.  If you use inhalers, bring them with you.  Bring a lunch or snack to eat after the ???exercise??? portion of the test.  Wear comfortable clothing and walking shoes. No metal at chest area (snaps, buttons).   Do not apply lotions, oils or powders to the chest.  If you have not received your results within 5-7 business days, please contact our office.     The patient was given the opportunity to ask questions and verbalized understanding of their instructions.     Please call our office at [phone] with any additional questions or concerns.

## 2022-02-02 ENCOUNTER — Ambulatory Visit: Admit: 2022-02-02 | Discharge: 2022-02-03 | Payer: MEDICARE

## 2022-02-02 DIAGNOSIS — G603 Idiopathic progressive neuropathy: Secondary | ICD-10-CM | POA: Diagnosis not present

## 2022-02-02 DIAGNOSIS — D649 Anemia, unspecified: Secondary | ICD-10-CM | POA: Diagnosis not present

## 2022-02-02 DIAGNOSIS — R109 Unspecified abdominal pain: Secondary | ICD-10-CM | POA: Diagnosis not present

## 2022-02-02 DIAGNOSIS — I6523 Occlusion and stenosis of bilateral carotid arteries: Secondary | ICD-10-CM | POA: Diagnosis not present

## 2022-02-02 DIAGNOSIS — R0602 Shortness of breath: Secondary | ICD-10-CM | POA: Diagnosis not present

## 2022-02-02 DIAGNOSIS — I483 Typical atrial flutter: Secondary | ICD-10-CM | POA: Diagnosis not present

## 2022-02-02 DIAGNOSIS — R0989 Other specified symptoms and signs involving the circulatory and respiratory systems: Secondary | ICD-10-CM | POA: Diagnosis not present

## 2022-02-02 DIAGNOSIS — E7849 Other hyperlipidemia: Secondary | ICD-10-CM | POA: Diagnosis not present

## 2022-02-02 DIAGNOSIS — I480 Paroxysmal atrial fibrillation: Secondary | ICD-10-CM | POA: Diagnosis not present

## 2022-02-02 MED ADMIN — Tc-99m Sestamibi (Cardiolite): 10 | INTRAVENOUS | @ 14:00:00 | Stop: 2022-02-02

## 2022-02-02 MED ADMIN — aminophylline injection 50 mg: 50 mg | INTRAVENOUS | @ 14:00:00 | Stop: 2022-02-02

## 2022-02-02 MED ADMIN — Tc-99m Sestamibi (Cardiolite): 10 | INTRAVENOUS | @ 13:00:00 | Stop: 2022-02-02

## 2022-02-02 MED ADMIN — regadenoson (LEXISCAN) injection: .4 mg | INTRAVENOUS | @ 14:00:00 | Stop: 2022-02-02

## 2022-02-03 ENCOUNTER — Ambulatory Visit: Admit: 2022-02-03 | Discharge: 2022-02-04 | Payer: MEDICARE

## 2022-02-03 DIAGNOSIS — R109 Unspecified abdominal pain: Principal | ICD-10-CM

## 2022-02-03 DIAGNOSIS — F32A Depression, unspecified: Secondary | ICD-10-CM | POA: Diagnosis not present

## 2022-02-03 DIAGNOSIS — R0989 Other specified symptoms and signs involving the circulatory and respiratory systems: Principal | ICD-10-CM

## 2022-02-03 DIAGNOSIS — H409 Unspecified glaucoma: Principal | ICD-10-CM

## 2022-02-03 DIAGNOSIS — F419 Anxiety disorder, unspecified: Principal | ICD-10-CM

## 2022-02-03 DIAGNOSIS — R42 Dizziness and giddiness: Principal | ICD-10-CM

## 2022-02-03 DIAGNOSIS — E785 Hyperlipidemia, unspecified: Principal | ICD-10-CM

## 2022-02-03 DIAGNOSIS — M545 Acute left-sided low back pain without sciatica: Principal | ICD-10-CM

## 2022-02-03 MED ORDER — LATANOPROST 0.005 % EYE DROPS
Freq: Every evening | OPHTHALMIC | 2 refills | 50.00000 days | Status: CP
Start: 2022-02-03 — End: 2022-02-03

## 2022-02-03 MED ORDER — MECLIZINE 25 MG TABLET
ORAL_TABLET | Freq: Three times a day (TID) | ORAL | 0 refills | 10 days | Status: CP | PRN
Start: 2022-02-03 — End: ?

## 2022-02-03 MED ORDER — DULOXETINE 30 MG CAPSULE,DELAYED RELEASE
ORAL_CAPSULE | Freq: Every day | ORAL | 0 refills | 180 days | Status: CP
Start: 2022-02-03 — End: ?

## 2022-02-03 NOTE — Unmapped (Signed)
Previously could not tolerate several statins, recently started on Zetia so far has been able to tolerate without any significant side effects.  Will check lipid profile and if remains suboptimally controlled, will start on Repatha.

## 2022-02-03 NOTE — Unmapped (Signed)
Bilateral internal carotid artery stenosis, recommended to continue with aspirin and lipid-lowering agent.  Will order follow-up carotid Doppler in a year.

## 2022-02-03 NOTE — Unmapped (Signed)
Mood symptoms fairly well controlled with the duloxetine and as needed clonazepam which patient takes rarely.

## 2022-02-03 NOTE — Unmapped (Signed)
Assessment and Plan:     There are no diagnoses linked to this encounter.       Subjective:     HPI: Beverly Cooper is a 76 y.o. female here for No chief complaint on file..   6 weeks follow-up for left-sided back pain with sciatica treated with muscle relaxers and x-ray of the spine ordered.  Left flank pain thought to be related to constipation alternating with diarrhea, x-ray of the abdomen ordered and referred to gastroenterology as previous lab work significant for iron deficiency anemia [scheduled with GI on 03/17/2022].  Patient had nuclear stress test today as ordered by cardiology for shortness of breath which is negative for CAD.  ABI ordered for PAD with normal bilateral PVR waveforms in the lower extremity arterial system.    X-ray of the abdomen and lumbar spine is not done yet.    ROS negative unless otherwise noted in HPI      Allergies:     Carisoprodol, Cyclobenzaprine, Naproxen, Pramipexole, Propoxyphene n-acetaminophen, Gabapentin, Atorvastatin, Celecoxib, Etodolac, Hydrocodone-acetaminophen, Neomycin-polymyxin, Pregabalin, Ropinirole hcl, and Rosuvastatin calcium    Current Medications:     Current Outpatient Medications   Medication Sig Dispense Refill    aspirin (ECOTRIN) 81 MG tablet Take 1 tablet (81 mg total) by mouth 3 (three) times a week. On Monday, Wednesday, Friday 30 tablet 5    cholecalciferol, vitamin D3-50 mcg, 2,000 unit,, 50 mcg (2,000 unit) cap Continuous.      clonazePAM (KLONOPIN) 0.5 MG tablet clonazepam 0.5 mg tablet   TAKE 1 TABLET AT BEDTIME ONCE A DAY IF NEEDED FOR SLEEP OR ANXIETY BY MOUTH (30 DAY SUPPLY) 30 DAYS      DULoxetine (CYMBALTA) 30 MG capsule Take 1 capsule (30 mg total) by mouth daily.      evolocumab 140 mg/mL PnIj Inject the contents of one pen (140 mg) under the skin every fourteen (14) days. 2 mL 11    ezetimibe (ZETIA) 10 mg tablet Take 1 tablet (10 mg total) by mouth at bedtime. 30 tablet 3    flecainide (TAMBOCOR) 50 MG tablet Take 1 tablet (50 mg total) by mouth Two (2) times a day.      gabapentin (NEURONTIN) 300 MG capsule Take 1 capsule (300 mg total) by mouth Three (3) times a day. 270 capsule 0    latanoprost (XALATAN) 0.005 % ophthalmic solution Administer 1 drop to both eyes nightly.      meclizine (ANTIVERT) 25 mg tablet meclizine 25 mg tablet   TAKE 1 TABLET BY MOUTH 3 TIMES A DAY DIZZINESS FOR 10 DAYS      multivit-min36/iron/folic acid (GERITOL COMPLETE ORAL) Take by mouth.      oxybutynin (DITROPAN) 5 MG tablet Take 1 tablet (5 mg total) by mouth two (2) times a day as needed (overactive bladder).      pantoprazole (PROTONIX) 40 MG tablet Take 1 tablet (40 mg total) by mouth daily.      propranoloL (INDERAL) 10 MG tablet propranolol 10 mg tablet   TAKE 1 TABLET BY MOUTH 4 TIMES A DAY AS NEEDED FOR HEART PALPITATIONS      tamoxifen (NOLVADEX) 10 MG tablet Take 1 tablet (10 mg total) by mouth daily.      tiZANidine (ZANAFLEX) 2 MG tablet Take 1 tablet (2 mg total) by mouth nightly as needed (muscle spasms). 30 tablet 0    UNABLE TO FIND Med Name: Prevagen      XARELTO 20 mg tablet Take 1 tablet (20  mg total) by mouth daily with evening meal.       No current facility-administered medications for this visit.       TSH   Date/Time Value Ref Range Status   12/16/2021 09:58 AM 4.001 0.550 - 4.780 uIU/mL Final        Immunizations:     Immunization History   Administered Date(s) Administered    COVID-19 VAC,BIVALENT(53YR UP),PFIZER 05/07/2021    COVID-19 VACC,MRNA,(PFIZER)(PF) 08/01/2019, 08/22/2019, 05/06/2020    INFLUENZA QUAD HIGH DOSE 3YRS+(FLUZONE) 05/14/2011, 04/15/2012, 05/27/2016, 06/30/2018, 05/11/2019, 04/26/2020    INFLUENZA TIV (TRI) 68MO+ W/ PRESERV (IM) 05/08/2008, 05/03/2009    Influenza Virus Vaccine, unspecified formulation 04/29/2017    PNEUMOCOCCAL POLYSACCHARIDE 23 05/14/2011       Objective:     There were no vitals filed for this visit.  There is no height or weight on file to calculate BMI.    Physical Exam     Health Maintenance Summary w/Most Recent Date    -        Overdue - DEXA Scan (Every 5 Years) Overdue - never done      No completion history exists for this topic.              Overdue - Hepatitis C Screen (Once) Overdue - never done      No completion history exists for this topic.              Overdue - DTaP/Tdap/Td Vaccines (1 - Tdap) Overdue - never done      No completion history exists for this topic.              Overdue - Zoster Vaccines (1 of 2) Overdue - never done      No completion history exists for this topic.              Overdue - Pneumococcal Vaccine 65+ (2 - PCV) Overdue since 05/13/2012      05/14/2011  Imm Admin: PNEUMOCOCCAL POLYSACCHARIDE 23              Influenza Vaccine (1) Next due on 03/13/2022      04/26/2020  Imm Admin: INFLUENZA QUAD HIGH DOSE 3YRS+(FLUZONE)    05/11/2019  Imm Admin: INFLUENZA QUAD HIGH DOSE 3YRS+(FLUZONE)    06/30/2018  Imm Admin: INFLUENZA QUAD HIGH DOSE 3YRS+(FLUZONE)    04/29/2017  Imm Admin: Influenza Virus Vaccine, unspecified formulation    05/27/2016  Imm Admin: INFLUENZA QUAD HIGH DOSE 3YRS+(FLUZONE)    Only the first 5 history entries have been loaded, but more history exists.              COVID-19 Vaccine (Series Information) Completed      05/07/2021  Imm Admin: COVID-19 VAC,BIVALENT(53YR UP),PFIZER    05/06/2020  Imm Admin: COVID-19 VACC,MRNA,(PFIZER)(PF)    08/22/2019  Imm Admin: COVID-19 VACC,MRNA,(PFIZER)(PF)    08/01/2019  Imm Admin: COVID-19 VACC,MRNA,(PFIZER)(PF)                       No follow-ups on file.      Note - This record has been created using [employer]. Chart creation errors have been sought, but may not always have been located. Such creation errors do not reflect on the standard of medical care. Extraocular movements intact.      Pupils: Pupils are equal, round, and reactive to light.   Neck:      Vascular: Carotid bruit (Bilateral) present.   Cardiovascular:  Rate and Rhythm: Normal rate and regular rhythm.      Pulses: Normal pulses.      Heart sounds: Normal heart sounds.   Pulmonary:      Effort: Pulmonary effort is normal.      Breath sounds: Normal breath sounds.   Abdominal:      General: Abdomen is flat. Bowel sounds are normal.      Palpations: Abdomen is soft.   Musculoskeletal:         General: Normal range of motion.      Cervical back: Normal range of motion.   Skin:     General: Skin is warm and dry.   Neurological:      General: No focal deficit present.      Mental Status: She is alert and oriented to person, place, and time.   Psychiatric:         Mood and Affect: Mood normal.         Behavior: Behavior normal.        Health Maintenance Summary w/Most Recent Date    -        Overdue - DEXA Scan (Every 5 Years) Overdue - never done      No completion history exists for this topic.              Overdue - Hepatitis C Screen (Once) Overdue - never done      No completion history exists for this topic.              Overdue - DTaP/Tdap/Td Vaccines (1 - Tdap) Overdue - never done      No completion history exists for this topic.              Overdue - Zoster Vaccines (1 of 2) Overdue - never done      No completion history exists for this topic.              Overdue - Pneumococcal Vaccine 65+ (2 - PCV) Overdue since 05/13/2012      05/14/2011  Imm Admin: PNEUMOCOCCAL POLYSACCHARIDE 23              Influenza Vaccine (1) Next due on 03/13/2022      04/26/2020  Imm Admin: INFLUENZA QUAD HIGH DOSE 15YRS+(FLUZONE)    05/11/2019  Imm Admin: INFLUENZA QUAD HIGH DOSE 15YRS+(FLUZONE)    06/30/2018  Imm Admin: INFLUENZA QUAD HIGH DOSE 15YRS+(FLUZONE)    04/29/2017  Imm Admin: Influenza Virus Vaccine, unspecified formulation    05/27/2016  Imm Admin: INFLUENZA QUAD HIGH DOSE 15YRS+(FLUZONE)    Only the first 5 history entries have been loaded, but more history exists.              COVID-19 Vaccine (Series Information) Completed      05/07/2021  Imm Admin: COVID-19 VAC,BIVALENT(44YR UP),PFIZER    05/06/2020  Imm Admin: COVID-19 VACC,MRNA,(PFIZER)(PF)    08/22/2019  Imm Admin: COVID-19 VACC,MRNA,(PFIZER)(PF)    08/01/2019  Imm Admin: COVID-19 VACC,MRNA,(PFIZER)(PF)                       Return in about 3 months (around 05/06/2022) for Annual physical - Medicare Wellness Visit .      Note - This record has been created using [employer]. Chart creation errors have been sought, but may not always have been located. Such creation errors do not reflect on the standard of medical care.

## 2022-02-03 NOTE — Unmapped (Signed)
Xalatan prescription refilled and recommended to follow-up with ophthalmology.

## 2022-02-03 NOTE — Unmapped (Signed)
Back pain is better.  

## 2022-02-03 NOTE — Unmapped (Signed)
Better, recommended to continue with the PPI, MiraLAX and follow-up with GI as scheduled.

## 2022-02-04 DIAGNOSIS — F419 Anxiety disorder, unspecified: Principal | ICD-10-CM

## 2022-02-04 DIAGNOSIS — F32A Anxiety and depression: Principal | ICD-10-CM

## 2022-02-04 MED ORDER — DULOXETINE 30 MG CAPSULE,DELAYED RELEASE
ORAL_CAPSULE | Freq: Every day | ORAL | 1 refills | 90 days | Status: CP
Start: 2022-02-04 — End: ?

## 2022-02-04 NOTE — Unmapped (Signed)
Patient is requesting the following refill  Requested Prescriptions     Pending Prescriptions Disp Refills    DULoxetine (CYMBALTA) 30 MG capsule 180 capsule 0     Sig: Take 1 capsule (30 mg total) by mouth daily.       Recent Visits  Date Type Provider Dept   02/03/22 Office Visit Arie Sabina, MD [facility] [address] At [facility]   12/23/21 Office Visit Arie Sabina, MD [place] [address] At [facility]   11/24/21 Office Visit Arie Sabina, MD [place] [facility] [address] At [facility]   Showing recent visits within past 365 days with a meds authorizing provider and meeting all other requirements  Future Appointments  Date Type Provider Dept   05/07/22 Appointment Arie Sabina, MD  Internal Medicine [address] At [facility]   Showing future appointments within next 365 days with a meds authorizing provider and meeting all other requirements       Labs: PHQ9:        No data to display

## 2022-03-02 ENCOUNTER — Other Ambulatory Visit: Payer: Self-pay

## 2022-03-02 NOTE — Patient Outreach (Signed)
[facility]) Care Management  03/02/2022  Nancy Kline June 28, 1946 [id number]   Telephone call to patient for nurse call.  No answer.  HIPAA compliant voice message left.    Plan: RN CM will attempt again within 4 business days and send letter.   Bary Leriche, RN, MSN [facility] Care Management Care Management Coordinator Direct Line [phone] Toll Free: [phone]  Fax: [phone]

## 2022-03-05 ENCOUNTER — Other Ambulatory Visit: Payer: Self-pay

## 2022-03-05 DIAGNOSIS — I739 Peripheral vascular disease, unspecified: Secondary | ICD-10-CM | POA: Diagnosis not present

## 2022-03-05 DIAGNOSIS — G609 Hereditary and idiopathic neuropathy, unspecified: Secondary | ICD-10-CM | POA: Diagnosis not present

## 2022-03-05 DIAGNOSIS — B351 Tinea unguium: Secondary | ICD-10-CM | POA: Diagnosis not present

## 2022-03-05 NOTE — Patient Outreach (Signed)
[facility]) Care Management  03/05/2022  ANNETH BRUNELL Jun 04, 1946 [id number]   Telephone call to patient for nurse call.  No answer. Unable to leave a message.  Plan: RN CM will attempt again within 4 business days.  Bary Leriche, RN, MSN [facility] Care Management Care Management Coordinator Direct Line [phone] Toll Free: [phone]  Fax: [phone]

## 2022-03-12 ENCOUNTER — Other Ambulatory Visit: Payer: Self-pay

## 2022-03-12 NOTE — Patient Outreach (Signed)
[facility]) Care Management  03/12/2022  Nancy Kline 1946/01/31 [id number]   Telephone call to patient for nurse call.  No answer.  HIPAA compliant voice message left.    Plan: RN CM will Close case.     Bary Leriche, RN, MSN [facility] Care Management Care Management Coordinator Direct Line [phone] Toll Free: [phone]  Fax: [phone]

## 2022-03-13 ENCOUNTER — Ambulatory Visit: Admit: 2022-03-13 | Discharge: 2022-03-14 | Payer: MEDICARE

## 2022-03-13 DIAGNOSIS — M65342 Trigger finger, left ring finger: Principal | ICD-10-CM

## 2022-03-13 DIAGNOSIS — M79605 Pain in left leg: Principal | ICD-10-CM

## 2022-03-13 DIAGNOSIS — R194 Change in bowel habit: Principal | ICD-10-CM

## 2022-03-13 DIAGNOSIS — E785 Hyperlipidemia, unspecified: Principal | ICD-10-CM

## 2022-03-13 DIAGNOSIS — G603 Idiopathic progressive neuropathy: Principal | ICD-10-CM

## 2022-03-13 DIAGNOSIS — D509 Iron deficiency anemia, unspecified: Principal | ICD-10-CM

## 2022-03-13 DIAGNOSIS — I6523 Occlusion and stenosis of bilateral carotid arteries: Principal | ICD-10-CM

## 2022-03-13 DIAGNOSIS — M79604 Pain in right leg: Principal | ICD-10-CM

## 2022-03-13 MED ORDER — FERROUS SULFATE 325 MG (65 MG IRON) TABLET,DELAYED RELEASE
ORAL_TABLET | Freq: Every day | ORAL | 5 refills | 30 days | Status: CP
Start: 2022-03-13 — End: 2022-09-09

## 2022-03-13 MED ORDER — GABAPENTIN 300 MG CAPSULE
ORAL_CAPSULE | 1 refills | 0 days | Status: CP
Start: 2022-03-13 — End: ?

## 2022-03-17 ENCOUNTER — Ambulatory Visit: Admit: 2022-03-17 | Discharge: 2022-03-18 | Payer: MEDICARE

## 2022-03-17 DIAGNOSIS — K219 Gastro-esophageal reflux disease without esophagitis: Principal | ICD-10-CM

## 2022-03-17 DIAGNOSIS — I480 Paroxysmal atrial fibrillation: Principal | ICD-10-CM

## 2022-03-17 DIAGNOSIS — Z7901 Long term (current) use of anticoagulants: Principal | ICD-10-CM

## 2022-03-17 DIAGNOSIS — R790 Abnormal level of blood mineral: Principal | ICD-10-CM

## 2022-03-17 DIAGNOSIS — D649 Anemia, unspecified: Principal | ICD-10-CM

## 2022-03-17 DIAGNOSIS — R109 Unspecified abdominal pain: Principal | ICD-10-CM

## 2022-03-17 MED ORDER — PEG-ELECTROLYTE SOLUTION 420 GRAM ORAL SOLUTION
0 refills | 0 days | Status: CP
Start: 2022-03-17 — End: ?

## 2022-03-18 ENCOUNTER — Ambulatory Visit
Admit: 2022-03-18 | Discharge: 2022-03-19 | Payer: MEDICARE | Attending: Physician Assistant | Primary: Physician Assistant

## 2022-03-18 ENCOUNTER — Ambulatory Visit: Admit: 2022-03-18 | Discharge: 2022-03-19 | Payer: MEDICARE

## 2022-03-18 DIAGNOSIS — M25562 Pain in left knee: Principal | ICD-10-CM

## 2022-03-18 DIAGNOSIS — G8929 Other chronic pain: Principal | ICD-10-CM

## 2022-03-18 DIAGNOSIS — M25561 Pain in right knee: Principal | ICD-10-CM

## 2022-03-18 DIAGNOSIS — M170 Bilateral primary osteoarthritis of knee: Principal | ICD-10-CM

## 2022-03-18 DIAGNOSIS — M25461 Effusion, right knee: Secondary | ICD-10-CM | POA: Diagnosis not present

## 2022-03-18 DIAGNOSIS — M25462 Effusion, left knee: Secondary | ICD-10-CM | POA: Diagnosis not present

## 2022-04-01 ENCOUNTER — Other Ambulatory Visit: Payer: Self-pay

## 2022-04-01 MED ORDER — PROPRANOLOL HCL 10 MG PO TABS: ORAL_TABLET | ORAL | 0 refills | Status: DC

## 2022-04-14 DIAGNOSIS — G603 Idiopathic progressive neuropathy: Principal | ICD-10-CM

## 2022-04-14 MED ORDER — GABAPENTIN 300 MG CAPSULE
ORAL_CAPSULE | 0 refills | 0 days
Start: 2022-04-14 — End: ?

## 2022-04-15 MED ORDER — GABAPENTIN 300 MG CAPSULE
ORAL_CAPSULE | 0 refills | 0 days
Start: 2022-04-15 — End: ?

## 2022-04-22 ENCOUNTER — Ambulatory Visit: Admit: 2022-04-22 | Discharge: 2022-04-23 | Payer: MEDICARE

## 2022-04-22 DIAGNOSIS — K2950 Unspecified chronic gastritis without bleeding: Secondary | ICD-10-CM | POA: Diagnosis not present

## 2022-04-22 DIAGNOSIS — K640 First degree hemorrhoids: Secondary | ICD-10-CM | POA: Diagnosis not present

## 2022-04-22 DIAGNOSIS — B9681 Helicobacter pylori [H. pylori] as the cause of diseases classified elsewhere: Secondary | ICD-10-CM | POA: Diagnosis not present

## 2022-04-22 DIAGNOSIS — D509 Iron deficiency anemia, unspecified: Secondary | ICD-10-CM | POA: Diagnosis not present

## 2022-04-22 DIAGNOSIS — E7800 Pure hypercholesterolemia, unspecified: Secondary | ICD-10-CM | POA: Diagnosis not present

## 2022-04-22 DIAGNOSIS — A048 Other specified bacterial intestinal infections: Secondary | ICD-10-CM | POA: Diagnosis not present

## 2022-04-22 DIAGNOSIS — K3189 Other diseases of stomach and duodenum: Secondary | ICD-10-CM | POA: Diagnosis not present

## 2022-04-22 DIAGNOSIS — K5730 Diverticulosis of large intestine without perforation or abscess without bleeding: Secondary | ICD-10-CM | POA: Diagnosis not present

## 2022-04-25 DIAGNOSIS — A048 Other specified bacterial intestinal infections: Principal | ICD-10-CM

## 2022-04-25 MED ORDER — BISMUTH SUBCIT K 140 MG-METRONIDAZOLE 125 MG-TETRACYCLINE 125 MG CAP
ORAL_CAPSULE | Freq: Four times a day (QID) | ORAL | 0 refills | 14 days | Status: CP
Start: 2022-04-25 — End: 2022-05-09

## 2022-04-30 ENCOUNTER — Other Ambulatory Visit: Admit: 2022-04-30 | Discharge: 2022-05-01 | Payer: MEDICARE

## 2022-04-30 DIAGNOSIS — G603 Idiopathic progressive neuropathy: Principal | ICD-10-CM

## 2022-04-30 DIAGNOSIS — A048 Other specified bacterial intestinal infections: Principal | ICD-10-CM

## 2022-04-30 DIAGNOSIS — E785 Hyperlipidemia, unspecified: Principal | ICD-10-CM

## 2022-04-30 DIAGNOSIS — D509 Iron deficiency anemia, unspecified: Principal | ICD-10-CM

## 2022-04-30 MED ORDER — PANTOPRAZOLE 40 MG TABLET,DELAYED RELEASE
ORAL_TABLET | Freq: Every day | ORAL | 0 refills | 90 days
Start: 2022-04-30 — End: ?

## 2022-04-30 MED ORDER — CLARITHROMYCIN 500 MG TABLET
ORAL_TABLET | Freq: Two times a day (BID) | ORAL | 0 refills | 5 days
Start: 2022-04-30 — End: 2022-05-05

## 2022-04-30 MED ORDER — METRONIDAZOLE 500 MG TABLET
ORAL_TABLET | Freq: Two times a day (BID) | ORAL | 0 refills | 5 days
Start: 2022-04-30 — End: 2022-05-05

## 2022-04-30 MED ORDER — AMOXICILLIN 500 MG CAPSULE
ORAL_CAPSULE | Freq: Two times a day (BID) | ORAL | 0 refills | 5 days
Start: 2022-04-30 — End: 2022-05-05

## 2022-05-01 MED ORDER — TETRACYCLINE 500 MG CAPSULE
ORAL_CAPSULE | Freq: Four times a day (QID) | ORAL | 0 refills | 14 days | Status: CP
Start: 2022-05-01 — End: 2022-05-15

## 2022-05-01 MED ORDER — PANTOPRAZOLE 40 MG TABLET,DELAYED RELEASE
ORAL_TABLET | Freq: Every day | ORAL | 0 refills | 90 days | Status: CP
Start: 2022-05-01 — End: ?

## 2022-05-01 MED ORDER — METRONIDAZOLE 250 MG TABLET
ORAL_TABLET | Freq: Four times a day (QID) | ORAL | 0 refills | 14 days | Status: CP
Start: 2022-05-01 — End: 2022-05-15

## 2022-05-01 MED ORDER — BISMUTH SUBSALICYLATE 262 MG TABLET
ORAL_TABLET | Freq: Four times a day (QID) | ORAL | 0 refills | 14 days | Status: CP
Start: 2022-05-01 — End: 2022-05-15

## 2022-05-07 ENCOUNTER — Ambulatory Visit: Admit: 2022-05-07 | Discharge: 2022-05-08 | Payer: MEDICARE

## 2022-05-07 DIAGNOSIS — Z124 Encounter for screening for malignant neoplasm of cervix: Principal | ICD-10-CM

## 2022-05-07 DIAGNOSIS — E785 Hyperlipidemia, unspecified: Principal | ICD-10-CM

## 2022-05-07 DIAGNOSIS — Z1211 Encounter for screening for malignant neoplasm of colon: Principal | ICD-10-CM

## 2022-05-07 DIAGNOSIS — Z9181 History of falling: Principal | ICD-10-CM

## 2022-05-07 DIAGNOSIS — Z0000 Encounter for general adult medical examination without abnormal findings: Principal | ICD-10-CM

## 2022-05-07 DIAGNOSIS — Z113 Encounter for screening for infections with a predominantly sexual mode of transmission: Principal | ICD-10-CM

## 2022-05-07 DIAGNOSIS — Z23 Encounter for immunization: Principal | ICD-10-CM

## 2022-05-07 DIAGNOSIS — A048 Other specified bacterial intestinal infections: Principal | ICD-10-CM

## 2022-05-07 DIAGNOSIS — Z13820 Encounter for screening for osteoporosis: Principal | ICD-10-CM

## 2022-05-07 DIAGNOSIS — Z1231 Encounter for screening mammogram for malignant neoplasm of breast: Principal | ICD-10-CM

## 2022-05-07 DIAGNOSIS — Z1159 Encounter for screening for other viral diseases: Principal | ICD-10-CM

## 2022-05-07 DIAGNOSIS — Z122 Encounter for screening for malignant neoplasm of respiratory organs: Principal | ICD-10-CM

## 2022-05-15 DIAGNOSIS — Z853 Personal history of malignant neoplasm of breast: Secondary | ICD-10-CM | POA: Diagnosis not present

## 2022-05-15 DIAGNOSIS — R92323 Mammographic fibroglandular density, bilateral breasts: Secondary | ICD-10-CM | POA: Diagnosis not present

## 2022-05-19 ENCOUNTER — Encounter: Payer: Self-pay | Admitting: Hematology and Oncology

## 2022-05-19 DIAGNOSIS — Z13820 Encounter for screening for osteoporosis: Secondary | ICD-10-CM | POA: Diagnosis not present

## 2022-05-19 DIAGNOSIS — M8588 Other specified disorders of bone density and structure, other site: Secondary | ICD-10-CM | POA: Diagnosis not present

## 2022-05-19 DIAGNOSIS — Z780 Asymptomatic menopausal state: Secondary | ICD-10-CM | POA: Diagnosis not present

## 2022-05-26 ENCOUNTER — Other Ambulatory Visit: Payer: Self-pay | Admitting: Internal Medicine

## 2022-05-26 DIAGNOSIS — E785 Hyperlipidemia, unspecified: Principal | ICD-10-CM

## 2022-05-26 DIAGNOSIS — I480 Paroxysmal atrial fibrillation: Secondary | ICD-10-CM

## 2022-05-26 MED ORDER — EZETIMIBE 10 MG TABLET
ORAL_TABLET | 3 refills | 0 days
Start: 2022-05-26 — End: ?

## 2022-05-27 ENCOUNTER — Other Ambulatory Visit: Payer: Self-pay | Admitting: Internal Medicine

## 2022-05-27 MED ORDER — EZETIMIBE 10 MG TABLET
ORAL_TABLET | 4 refills | 0 days | Status: CP
Start: 2022-05-27 — End: ?

## 2022-05-27 NOTE — Telephone Encounter (Signed)
Prescription refill request for Xarelto received.  Indication: Last office visit: 03/18/21  Rosette Reveal MD Weight: Age: Scr: CrCl:

## 2022-05-28 ENCOUNTER — Other Ambulatory Visit: Payer: Self-pay | Admitting: Internal Medicine

## 2022-05-28 DIAGNOSIS — I480 Paroxysmal atrial fibrillation: Secondary | ICD-10-CM

## 2022-05-28 NOTE — Telephone Encounter (Signed)
Prescription refill request for Xarelto received.  Indication: PAF Last office visit: 03/18/21  Nancy Reveal MD Weight: 84.5kg Age: 76 Scr: 0.93 on 12/16/21 CrCl: 67.58  Based on above findings Xarelto 20mg  daily is the appropriate dose.  Refill approved.

## 2022-05-31 NOTE — Progress Notes (Signed)
Patient Care Team: Robbie Lis, MD as PCP - General (Internal Medicine) Evans Lance, MD as PCP - Electrophysiology (Cardiology) Jovita Kussmaul, MD as Consulting Physician (General Surgery) Nicholas Lose, MD as Consulting Physician (Hematology and Oncology) Kyung Rudd, MD as Consulting Physician (Radiation Oncology)  DIAGNOSIS: No diagnosis found.  SUMMARY OF ONCOLOGIC HISTORY: Oncology History  Ductal carcinoma in situ (DCIS) of right breast  05/01/2021 Initial Diagnosis   Screening mammogram: indeterminate grouped calcifications's and a 1 cm in the right breast.  Stereotactic biopsy: Low-grade DCIS with calcifications, ER/PR+(100%).   05/14/2021 Cancer Staging   Staging form: Breast, AJCC 8th Edition - Clinical stage from 05/14/2021: Stage 0 (cTis (DCIS), cN0, cM0, ER+, PR+, HER2: Not Assessed) - Signed by Nicholas Lose, MD on 05/14/2021 Nuclear grade: G1   07/2021 -  Anti-estrogen oral therapy   Tamoxifen daily     CHIEF COMPLIANT: Follow-up after surgery  INTERVAL HISTORY: Nancy Kline is a 76 y.o. female is here because of recent diagnosis of DCIS of the right breast. Screening mammogram on 04/02/2021 showed indeterminate grouped calcifications in the right breast.    ALLERGIES:  is allergic to atorvastatin, carisoprodol, celebrex [celecoxib], crestor  [rosuvastatin calcium], cyclobenzaprine, darvocet [propoxyphene n-acetaminophen], darvon  [propoxyphene], etodolac, flexeril [cyclobenzaprine hcl], hydrocodone-acetaminophen, pramipexole dihydrochloride, pregabalin, pregabalin, and ropinirole hcl.  MEDICATIONS:  Current Outpatient Medications  Medication Sig Dispense Refill   cholecalciferol (VITAMIN D) 1000 UNITS tablet Take 1,000 Units by mouth daily.     clonazePAM (KLONOPIN) 0.5 MG tablet Take 0.5 tablets by mouth as needed.     DULoxetine (CYMBALTA) 20 MG capsule Take 20 mg by mouth daily.     flecainide (TAMBOCOR) 50 MG tablet Take 1 tablet (50 mg total)  by mouth 2 (two) times daily. 60 tablet 11   gabapentin (NEURONTIN) 300 MG capsule Take 300 mg by mouth 3 (three) times daily.     glucosamine-chondroitin 500-400 MG tablet Take 1 tablet by mouth daily. (Patient not taking: Reported on 11/27/2021)     latanoprost (XALATAN) 0.005 % ophthalmic solution 1 drop at bedtime.     Magnesium 250 MG TABS Take 1 tablet (250 mg total) by mouth daily. (Patient not taking: Reported on 11/27/2021)  0   meclizine (ANTIVERT) 50 MG tablet Take 50 mg by mouth 3 (three) times daily as needed.     pantoprazole (PROTONIX) 40 MG tablet Take 1 tablet by mouth daily as needed.      propranolol (INDERAL) 10 MG tablet TAKE 1 TABLET BY MOUTH DAILY. MAY TAKE UP TO AN ADDITIONAL 3 TABLETS BY MOUTH DAILY FOR BREAKTHROUGH PALPITATIONS 90 tablet 2   tamoxifen (NOLVADEX) 10 MG tablet Take 1 tablet (10 mg total) by mouth daily. 90 tablet 3   XARELTO 20 MG TABS tablet TAKE 1 TABLET(20 MG) BY MOUTH DAILY WITH SUPPER 90 tablet 1   No current facility-administered medications for this visit.    PHYSICAL EXAMINATION: ECOG PERFORMANCE STATUS: {CHL ONC ECOG PS:[id number]}  There were no vitals filed for this visit. There were no vitals filed for this visit.  BREAST:*** No palpable masses or nodules in either right or left breasts. No palpable axillary supraclavicular or infraclavicular adenopathy no breast tenderness or nipple discharge. (exam performed in the presence of a chaperone)  LABORATORY DATA:  I have reviewed the data as listed    Latest Ref Rng & Units 05/14/2021   12:35 PM 04/03/2020    1:00 PM 09/22/2018    4:32 PM  CMP  Glucose 70 - 99 mg/dL 109  87  103   BUN 8 - 23 mg/dL _0 Creatinine 0.44 - 1.00 mg/dL 0.91  1.03  0.90   Sodium 135 - 145 mmol/L 137  137  140   Potassium 3.5 - 5.1 mmol/L 4.1  4.5  4.4   Chloride 98 - 111 mmol/L 101  99  100   CO2 22 - 32 mmol/L _1 Calcium 8.9 - 10.3 mg/dL 9.1  9.3  9.6   Total Protein 6.5 - 8.1 g/dL 7.0    6.9   Total Bilirubin 0.3 - 1.2 mg/dL 0.9   0.3   Alkaline Phos 38 - 126 U/L 62   65   AST 15 - 41 U/L 18   16   ALT 0 - 44 U/L 8   7     Lab Results  Component Value Date   WBC 4.4 05/14/2021   HGB 9.6 (L) 05/14/2021   HCT 27.9 (L) 05/14/2021   MCV 98.9 05/14/2021   PLT 167 05/14/2021   NEUTROABS 2.7 05/14/2021    ASSESSMENT & PLAN:  No problem-specific Assessment & Plan notes found for this encounter.    No orders of the defined types were placed in this encounter.  The patient has a good understanding of the overall plan. she agrees with it. she will call with any problems that may develop before the next visit here. Total time spent: 30 mins including face to face time and time spent for planning, charting and co-ordination of care   Suzzette Righter, [place] 05/31/22    I Gardiner Coins am scribing for Dr. Lindi Adie  ***

## 2022-06-01 ENCOUNTER — Inpatient Hospital Stay: Payer: Medicare PPO | Attending: Hematology and Oncology | Admitting: Hematology and Oncology

## 2022-06-01 ENCOUNTER — Other Ambulatory Visit: Payer: Self-pay

## 2022-06-01 VITALS — BP 127/51 | HR 84 | Temp 97.2°F | Resp 18 | Ht 67.0 in | Wt 183.0 lb

## 2022-06-01 DIAGNOSIS — Z79810 Long term (current) use of selective estrogen receptor modulators (SERMs): Secondary | ICD-10-CM | POA: Diagnosis not present

## 2022-06-01 DIAGNOSIS — D0511 Intraductal carcinoma in situ of right breast: Secondary | ICD-10-CM | POA: Diagnosis not present

## 2022-06-01 MED ORDER — TAMOXIFEN CITRATE 10 MG PO TABS: 10.0000 mg | ORAL_TABLET | Freq: Every day | ORAL | 3 refills | Status: DC

## 2022-06-01 NOTE — Assessment & Plan Note (Addendum)
05/01/2021:Screening mammogram: indeterminate grouped calcifications's and a 1 cm in the right breast.  Stereotactic biopsy: Low-grade DCIS with calcifications, ER/PR+(100%). 06/27/2021: Right lumpectomy: Fibrocystic changes, no residual DCIS   Treatment plan: 1.  Decided against radiation (had a second at [facility]) 2. Followed by antiestrogen therapy with tamoxifen 5 years (started 07/15/2021 at 10 mg daily based on TAM-01 trial) ------------------------------------------------------------------------------------------------------------------ Tamoxifen toxicities: No adverse effects to tamoxifen therapy  Breast cancer surveillance: Mammogram 05/15/2022: Benign  Patient would like to follow-up closer to her home because she is about [place] hours away from [place].  I believe that is perfectly reasonable.  She will make an appointment with a local oncologist in the next 6 months so that she can be established and she will need mammograms November every year.  I sent a refill of tamoxifen for another year.

## 2022-06-02 ENCOUNTER — Telehealth: Payer: Self-pay | Admitting: Hematology and Oncology

## 2022-06-02 DIAGNOSIS — L603 Nail dystrophy: Secondary | ICD-10-CM | POA: Diagnosis not present

## 2022-06-02 DIAGNOSIS — I739 Peripheral vascular disease, unspecified: Secondary | ICD-10-CM | POA: Diagnosis not present

## 2022-06-02 DIAGNOSIS — B351 Tinea unguium: Secondary | ICD-10-CM | POA: Diagnosis not present

## 2022-06-02 DIAGNOSIS — L853 Xerosis cutis: Secondary | ICD-10-CM | POA: Diagnosis not present

## 2022-06-02 NOTE — Telephone Encounter (Signed)
Called patient to schedule a one year FU with Dr.Gudena per his 11/20 los. At this time, the patient did not want to schedule any FU appointments. Patient states she will call if she needs to see Dr.Gudena again.

## 2022-06-16 ENCOUNTER — Ambulatory Visit: Admit: 2022-06-16 | Discharge: 2022-06-16 | Payer: MEDICARE

## 2022-06-16 DIAGNOSIS — K2970 Gastritis, unspecified, without bleeding: Principal | ICD-10-CM

## 2022-06-16 DIAGNOSIS — D539 Nutritional anemia, unspecified: Principal | ICD-10-CM

## 2022-06-16 DIAGNOSIS — I480 Paroxysmal atrial fibrillation: Principal | ICD-10-CM

## 2022-06-16 DIAGNOSIS — R790 Abnormal level of blood mineral: Principal | ICD-10-CM

## 2022-06-16 DIAGNOSIS — Z7901 Long term (current) use of anticoagulants: Principal | ICD-10-CM

## 2022-06-16 DIAGNOSIS — K219 Gastro-esophageal reflux disease without esophagitis: Principal | ICD-10-CM

## 2022-06-16 DIAGNOSIS — B9681 Helicobacter pylori [H. pylori] as the cause of diseases classified elsewhere: Principal | ICD-10-CM

## 2022-06-16 DIAGNOSIS — Z79899 Other long term (current) drug therapy: Secondary | ICD-10-CM | POA: Diagnosis not present

## 2022-06-16 DIAGNOSIS — H401132 Primary open-angle glaucoma, bilateral, moderate stage: Secondary | ICD-10-CM | POA: Diagnosis not present

## 2022-06-17 ENCOUNTER — Encounter: Admit: 2022-06-17 | Discharge: 2022-06-18 | Payer: MEDICARE

## 2022-06-17 ENCOUNTER — Ambulatory Visit: Admit: 2022-06-17 | Discharge: 2022-06-18 | Payer: MEDICARE

## 2022-06-17 DIAGNOSIS — D539 Nutritional anemia, unspecified: Principal | ICD-10-CM

## 2022-06-17 DIAGNOSIS — A048 Other specified bacterial intestinal infections: Secondary | ICD-10-CM | POA: Diagnosis not present

## 2022-07-22 DIAGNOSIS — D539 Nutritional anemia, unspecified: Principal | ICD-10-CM

## 2022-07-23 ENCOUNTER — Ambulatory Visit: Admit: 2022-07-23 | Discharge: 2022-07-24 | Payer: MEDICARE

## 2022-07-23 ENCOUNTER — Ambulatory Visit
Admit: 2022-07-23 | Discharge: 2022-07-24 | Payer: MEDICARE | Attending: Hematology & Oncology | Primary: Hematology & Oncology

## 2022-07-23 DIAGNOSIS — D539 Nutritional anemia, unspecified: Principal | ICD-10-CM

## 2022-07-23 DIAGNOSIS — A048 Other specified bacterial intestinal infections: Principal | ICD-10-CM

## 2022-07-23 MED ORDER — PANTOPRAZOLE 40 MG TABLET,DELAYED RELEASE
ORAL_TABLET | Freq: Every day | ORAL | 0 refills | 0 days
Start: 2022-07-23 — End: ?

## 2022-07-24 MED ORDER — PANTOPRAZOLE 40 MG TABLET,DELAYED RELEASE
ORAL_TABLET | Freq: Every day | ORAL | 0 refills | 90 days | Status: CP
Start: 2022-07-24 — End: ?

## 2022-08-01 ENCOUNTER — Other Ambulatory Visit: Payer: Self-pay | Admitting: Hematology and Oncology

## 2022-08-05 ENCOUNTER — Ambulatory Visit: Admit: 2022-08-05 | Discharge: 2022-08-06 | Payer: MEDICARE

## 2022-08-05 DIAGNOSIS — D0511 Intraductal carcinoma in situ of right breast: Principal | ICD-10-CM

## 2022-08-05 DIAGNOSIS — R42 Dizziness and giddiness: Principal | ICD-10-CM

## 2022-08-05 DIAGNOSIS — F419 Anxiety disorder, unspecified: Principal | ICD-10-CM

## 2022-08-05 DIAGNOSIS — F5104 Psychophysiologic insomnia: Principal | ICD-10-CM

## 2022-08-05 DIAGNOSIS — G603 Idiopathic progressive neuropathy: Principal | ICD-10-CM

## 2022-08-05 DIAGNOSIS — F32A Anxiety and depression: Principal | ICD-10-CM

## 2022-08-05 DIAGNOSIS — M79604 Pain in right leg: Principal | ICD-10-CM

## 2022-08-05 DIAGNOSIS — M65342 Trigger finger, left ring finger: Principal | ICD-10-CM

## 2022-08-05 DIAGNOSIS — E785 Hyperlipidemia, unspecified: Principal | ICD-10-CM

## 2022-08-05 DIAGNOSIS — D539 Nutritional anemia, unspecified: Principal | ICD-10-CM

## 2022-08-05 DIAGNOSIS — M79605 Pain in left leg: Principal | ICD-10-CM

## 2022-08-05 MED ORDER — DULOXETINE 30 MG CAPSULE,DELAYED RELEASE
ORAL_CAPSULE | Freq: Every day | ORAL | 1 refills | 90 days | Status: CP
Start: 2022-08-05 — End: ?

## 2022-08-05 MED ORDER — MECLIZINE 25 MG TABLET
ORAL_TABLET | Freq: Three times a day (TID) | ORAL | 0 refills | 10 days | Status: CP | PRN
Start: 2022-08-05 — End: ?

## 2022-08-27 ENCOUNTER — Ambulatory Visit: Admit: 2022-08-27 | Discharge: 2022-08-28 | Payer: MEDICARE

## 2022-08-27 DIAGNOSIS — F32A Depression, unspecified: Secondary | ICD-10-CM | POA: Diagnosis not present

## 2022-08-27 DIAGNOSIS — L299 Pruritus, unspecified: Principal | ICD-10-CM

## 2022-08-27 DIAGNOSIS — R42 Dizziness and giddiness: Principal | ICD-10-CM

## 2022-08-27 DIAGNOSIS — N3946 Mixed incontinence: Principal | ICD-10-CM

## 2022-08-27 DIAGNOSIS — Z9181 History of falling: Principal | ICD-10-CM

## 2022-08-27 DIAGNOSIS — F419 Anxiety disorder, unspecified: Principal | ICD-10-CM

## 2022-08-27 MED ORDER — OXYBUTYNIN CHLORIDE 5 MG TABLET
ORAL_TABLET | Freq: Every evening | ORAL | 0 refills | 90 days | Status: CP
Start: 2022-08-27 — End: ?

## 2022-08-27 MED ORDER — KETOCONAZOLE 2 % SHAMPOO
TOPICAL | 1 refills | 0 days | Status: CP
Start: 2022-08-27 — End: 2022-09-26

## 2022-08-27 MED ORDER — MECLIZINE 25 MG TABLET
ORAL_TABLET | Freq: Three times a day (TID) | ORAL | 0 refills | 10 days | Status: CP | PRN
Start: 2022-08-27 — End: ?

## 2022-08-27 MED ORDER — HYDROXYZINE HCL 25 MG TABLET
ORAL_TABLET | Freq: Three times a day (TID) | ORAL | 0 refills | 10 days | Status: CP | PRN
Start: 2022-08-27 — End: ?

## 2022-09-02 DIAGNOSIS — B351 Tinea unguium: Secondary | ICD-10-CM | POA: Diagnosis not present

## 2022-09-02 DIAGNOSIS — I7091 Generalized atherosclerosis: Secondary | ICD-10-CM | POA: Diagnosis not present

## 2022-09-02 DIAGNOSIS — L603 Nail dystrophy: Secondary | ICD-10-CM | POA: Diagnosis not present

## 2022-09-16 ENCOUNTER — Ambulatory Visit: Admit: 2022-09-16 | Discharge: 2022-09-17 | Payer: MEDICARE

## 2022-09-16 DIAGNOSIS — I499 Cardiac arrhythmia, unspecified: Principal | ICD-10-CM

## 2022-09-16 DIAGNOSIS — M545 Low back pain, unspecified: Secondary | ICD-10-CM | POA: Diagnosis not present

## 2022-09-22 DIAGNOSIS — G629 Polyneuropathy, unspecified: Secondary | ICD-10-CM | POA: Diagnosis not present

## 2022-09-22 DIAGNOSIS — I4891 Unspecified atrial fibrillation: Secondary | ICD-10-CM | POA: Diagnosis not present

## 2022-09-22 DIAGNOSIS — Z7901 Long term (current) use of anticoagulants: Secondary | ICD-10-CM | POA: Diagnosis not present

## 2022-09-22 DIAGNOSIS — K219 Gastro-esophageal reflux disease without esophagitis: Secondary | ICD-10-CM | POA: Diagnosis not present

## 2022-09-22 DIAGNOSIS — R918 Other nonspecific abnormal finding of lung field: Secondary | ICD-10-CM | POA: Diagnosis not present

## 2022-09-22 DIAGNOSIS — M545 Low back pain, unspecified: Secondary | ICD-10-CM | POA: Diagnosis not present

## 2022-09-22 DIAGNOSIS — D696 Thrombocytopenia, unspecified: Secondary | ICD-10-CM | POA: Diagnosis not present

## 2022-09-22 DIAGNOSIS — E785 Hyperlipidemia, unspecified: Secondary | ICD-10-CM | POA: Diagnosis not present

## 2022-09-22 DIAGNOSIS — R059 Cough, unspecified: Secondary | ICD-10-CM | POA: Diagnosis not present

## 2022-09-22 DIAGNOSIS — R0600 Dyspnea, unspecified: Secondary | ICD-10-CM | POA: Diagnosis not present

## 2022-09-22 DIAGNOSIS — I480 Paroxysmal atrial fibrillation: Secondary | ICD-10-CM | POA: Diagnosis not present

## 2022-09-22 DIAGNOSIS — R5383 Other fatigue: Secondary | ICD-10-CM | POA: Diagnosis not present

## 2022-09-22 DIAGNOSIS — D649 Anemia, unspecified: Secondary | ICD-10-CM | POA: Diagnosis not present

## 2022-09-22 DIAGNOSIS — D61818 Other pancytopenia: Secondary | ICD-10-CM | POA: Diagnosis not present

## 2022-09-22 DIAGNOSIS — R0602 Shortness of breath: Secondary | ICD-10-CM | POA: Diagnosis not present

## 2022-09-22 DIAGNOSIS — K5900 Constipation, unspecified: Secondary | ICD-10-CM | POA: Diagnosis not present

## 2022-09-22 DIAGNOSIS — R188 Other ascites: Secondary | ICD-10-CM | POA: Diagnosis not present

## 2022-09-22 DIAGNOSIS — R051 Acute cough: Secondary | ICD-10-CM | POA: Diagnosis not present

## 2022-09-23 DIAGNOSIS — D696 Thrombocytopenia, unspecified: Secondary | ICD-10-CM | POA: Diagnosis not present

## 2022-09-23 DIAGNOSIS — I480 Paroxysmal atrial fibrillation: Secondary | ICD-10-CM | POA: Diagnosis not present

## 2022-09-23 DIAGNOSIS — D61818 Other pancytopenia: Secondary | ICD-10-CM | POA: Diagnosis not present

## 2022-09-23 DIAGNOSIS — D649 Anemia, unspecified: Secondary | ICD-10-CM | POA: Diagnosis not present

## 2022-09-29 ENCOUNTER — Ambulatory Visit: Admit: 2022-09-29 | Discharge: 2022-09-30 | Payer: MEDICARE

## 2022-09-29 ENCOUNTER — Other Ambulatory Visit: Admit: 2022-09-29 | Discharge: 2022-09-30 | Payer: MEDICARE

## 2022-09-29 DIAGNOSIS — D61818 Other pancytopenia: Principal | ICD-10-CM

## 2022-10-01 ENCOUNTER — Ambulatory Visit: Admit: 2022-10-01 | Discharge: 2022-10-01 | Payer: MEDICARE

## 2022-10-01 ENCOUNTER — Ambulatory Visit
Admit: 2022-10-01 | Discharge: 2022-10-01 | Payer: MEDICARE | Attending: Nurse Practitioner | Primary: Nurse Practitioner

## 2022-10-01 DIAGNOSIS — D539 Nutritional anemia, unspecified: Principal | ICD-10-CM

## 2022-10-05 ENCOUNTER — Ambulatory Visit: Admit: 2022-10-05 | Discharge: 2022-10-06 | Payer: MEDICARE

## 2022-10-05 ENCOUNTER — Encounter
Admit: 2022-10-05 | Discharge: 2022-10-06 | Payer: MEDICARE | Attending: Nurse Practitioner | Primary: Nurse Practitioner

## 2022-10-05 DIAGNOSIS — D539 Nutritional anemia, unspecified: Principal | ICD-10-CM

## 2022-10-07 ENCOUNTER — Ambulatory Visit: Admit: 2022-10-07 | Discharge: 2022-10-07 | Payer: MEDICARE

## 2022-10-07 ENCOUNTER — Institutional Professional Consult (permissible substitution): Admit: 2022-10-07 | Discharge: 2022-10-07 | Payer: MEDICARE

## 2022-10-07 DIAGNOSIS — D649 Anemia, unspecified: Principal | ICD-10-CM

## 2022-10-08 ENCOUNTER — Ambulatory Visit
Admit: 2022-10-08 | Discharge: 2022-10-08 | Payer: MEDICARE | Attending: Nurse Practitioner | Primary: Nurse Practitioner

## 2022-10-08 ENCOUNTER — Ambulatory Visit: Admit: 2022-10-08 | Discharge: 2022-10-08 | Payer: MEDICARE

## 2022-10-08 DIAGNOSIS — D539 Nutritional anemia, unspecified: Principal | ICD-10-CM

## 2022-10-09 ENCOUNTER — Other Ambulatory Visit: Payer: Self-pay | Admitting: Internal Medicine

## 2022-10-09 ENCOUNTER — Other Ambulatory Visit: Payer: Self-pay

## 2022-10-09 MED ORDER — FLECAINIDE ACETATE 50 MG PO TABS: 50.0000 mg | ORAL_TABLET | Freq: Two times a day (BID) | ORAL | 0 refills | Status: DC

## 2022-10-12 ENCOUNTER — Ambulatory Visit: Admit: 2022-10-12 | Discharge: 2022-10-12 | Payer: MEDICARE

## 2022-10-12 ENCOUNTER — Institutional Professional Consult (permissible substitution): Admit: 2022-10-12 | Discharge: 2022-10-12 | Payer: MEDICARE

## 2022-10-12 ENCOUNTER — Telehealth: Payer: Self-pay

## 2022-10-12 DIAGNOSIS — D539 Nutritional anemia, unspecified: Principal | ICD-10-CM

## 2022-10-12 NOTE — Telephone Encounter (Signed)
Returned daughter's call regarding tamoxifen. Daughter asking if it is ok to continue to hold tamoxifen per hospitalist request. Pt has held rx for the last 2 weeks at hospitalist request and is making an appt with a new oncologist at [facility]. Advised that it is ok to hold tamoxifen until new oncology appt is made. Daughter verbalized understanding.

## 2022-10-14 ENCOUNTER — Ambulatory Visit: Admit: 2022-10-14 | Discharge: 2022-10-14 | Payer: MEDICARE

## 2022-10-14 DIAGNOSIS — D539 Nutritional anemia, unspecified: Secondary | ICD-10-CM | POA: Diagnosis not present

## 2022-10-14 DIAGNOSIS — D7589 Other specified diseases of blood and blood-forming organs: Secondary | ICD-10-CM | POA: Diagnosis not present

## 2022-10-14 DIAGNOSIS — D61818 Other pancytopenia: Secondary | ICD-10-CM | POA: Diagnosis not present

## 2022-10-15 ENCOUNTER — Encounter
Admit: 2022-10-15 | Discharge: 2022-10-15 | Payer: MEDICARE | Attending: Hematology & Oncology | Primary: Hematology & Oncology

## 2022-10-15 ENCOUNTER — Ambulatory Visit: Admit: 2022-10-15 | Discharge: 2022-10-15 | Payer: MEDICARE

## 2022-10-15 ENCOUNTER — Institutional Professional Consult (permissible substitution): Admit: 2022-10-15 | Discharge: 2022-10-15 | Payer: MEDICARE

## 2022-10-15 DIAGNOSIS — D61818 Other pancytopenia: Principal | ICD-10-CM

## 2022-10-15 DIAGNOSIS — D0511 Intraductal carcinoma in situ of right breast: Principal | ICD-10-CM

## 2022-10-15 DIAGNOSIS — D539 Nutritional anemia, unspecified: Principal | ICD-10-CM

## 2022-10-16 ENCOUNTER — Ambulatory Visit: Admit: 2022-10-16 | Discharge: 2022-10-17 | Payer: MEDICARE

## 2022-10-16 DIAGNOSIS — D539 Nutritional anemia, unspecified: Principal | ICD-10-CM

## 2022-10-16 DIAGNOSIS — D61818 Other pancytopenia: Principal | ICD-10-CM

## 2022-10-19 DIAGNOSIS — A048 Other specified bacterial intestinal infections: Principal | ICD-10-CM

## 2022-10-19 MED ORDER — PANTOPRAZOLE 40 MG TABLET,DELAYED RELEASE
ORAL_TABLET | Freq: Every day | ORAL | 0 refills | 0 days
Start: 2022-10-19 — End: ?

## 2022-10-20 ENCOUNTER — Institutional Professional Consult (permissible substitution): Admit: 2022-10-20 | Discharge: 2022-10-21 | Payer: MEDICARE

## 2022-10-20 ENCOUNTER — Other Ambulatory Visit: Admit: 2022-10-20 | Discharge: 2022-10-21 | Payer: MEDICARE

## 2022-10-20 DIAGNOSIS — D539 Nutritional anemia, unspecified: Principal | ICD-10-CM

## 2022-10-20 DIAGNOSIS — D61818 Other pancytopenia: Principal | ICD-10-CM

## 2022-10-20 MED ORDER — PANTOPRAZOLE 40 MG TABLET,DELAYED RELEASE
ORAL_TABLET | Freq: Every day | ORAL | 1 refills | 90 days | Status: CP
Start: 2022-10-20 — End: ?

## 2022-10-22 ENCOUNTER — Ambulatory Visit: Admit: 2022-10-22 | Discharge: 2022-10-22 | Payer: MEDICARE

## 2022-10-22 ENCOUNTER — Ambulatory Visit
Admit: 2022-10-22 | Discharge: 2022-10-22 | Payer: MEDICARE | Attending: Hematology & Oncology | Primary: Hematology & Oncology

## 2022-10-22 DIAGNOSIS — D539 Nutritional anemia, unspecified: Principal | ICD-10-CM

## 2022-10-26 DIAGNOSIS — D469 Myelodysplastic syndrome, unspecified: Principal | ICD-10-CM

## 2022-10-27 ENCOUNTER — Institutional Professional Consult (permissible substitution): Admit: 2022-10-27 | Discharge: 2022-10-27 | Payer: MEDICARE

## 2022-10-27 ENCOUNTER — Other Ambulatory Visit: Admit: 2022-10-27 | Discharge: 2022-10-27 | Payer: MEDICARE

## 2022-10-27 DIAGNOSIS — D539 Nutritional anemia, unspecified: Principal | ICD-10-CM

## 2022-10-27 DIAGNOSIS — D61818 Other pancytopenia: Principal | ICD-10-CM

## 2022-10-27 DIAGNOSIS — G603 Idiopathic progressive neuropathy: Principal | ICD-10-CM

## 2022-10-27 MED ORDER — GABAPENTIN 300 MG CAPSULE
ORAL_CAPSULE | 1 refills | 0 days
Start: 2022-10-27 — End: ?

## 2022-10-28 MED ORDER — GABAPENTIN 300 MG CAPSULE
ORAL_CAPSULE | 5 refills | 0 days | Status: CP
Start: 2022-10-28 — End: ?

## 2022-10-29 ENCOUNTER — Encounter
Admit: 2022-10-29 | Discharge: 2022-10-30 | Payer: MEDICARE | Attending: Hematology & Oncology | Primary: Hematology & Oncology

## 2022-10-29 ENCOUNTER — Ambulatory Visit: Admit: 2022-10-29 | Discharge: 2022-10-30 | Payer: MEDICARE

## 2022-10-29 ENCOUNTER — Ambulatory Visit
Admit: 2022-10-29 | Discharge: 2022-10-30 | Payer: MEDICARE | Attending: Hematology & Oncology | Primary: Hematology & Oncology

## 2022-10-29 ENCOUNTER — Other Ambulatory Visit: Admit: 2022-10-29 | Discharge: 2022-10-30 | Payer: MEDICARE

## 2022-10-29 DIAGNOSIS — D539 Nutritional anemia, unspecified: Principal | ICD-10-CM

## 2022-10-29 DIAGNOSIS — D469 Myelodysplastic syndrome, unspecified: Principal | ICD-10-CM

## 2022-10-29 DIAGNOSIS — D61818 Other pancytopenia: Secondary | ICD-10-CM | POA: Diagnosis not present

## 2022-10-29 MED ORDER — VALACYCLOVIR 500 MG TABLET
ORAL_TABLET | Freq: Two times a day (BID) | ORAL | 4 refills | 30 days | Status: CP
Start: 2022-10-29 — End: 2022-10-29

## 2022-11-02 DIAGNOSIS — D469 Myelodysplastic syndrome, unspecified: Principal | ICD-10-CM

## 2022-11-02 DIAGNOSIS — D61818 Other pancytopenia: Principal | ICD-10-CM

## 2022-11-03 ENCOUNTER — Ambulatory Visit: Admit: 2022-11-03 | Discharge: 2022-11-03 | Payer: MEDICARE

## 2022-11-03 DIAGNOSIS — D469 Myelodysplastic syndrome, unspecified: Principal | ICD-10-CM

## 2022-11-03 DIAGNOSIS — D0511 Intraductal carcinoma in situ of right breast: Principal | ICD-10-CM

## 2022-11-03 DIAGNOSIS — Z1159 Encounter for screening for other viral diseases: Principal | ICD-10-CM

## 2022-11-03 DIAGNOSIS — Z5111 Encounter for antineoplastic chemotherapy: Secondary | ICD-10-CM | POA: Diagnosis not present

## 2022-11-03 MED ORDER — PROCHLORPERAZINE MALEATE 10 MG TABLET
ORAL_TABLET | Freq: Four times a day (QID) | ORAL | 2 refills | 8 days | Status: CP | PRN
Start: 2022-11-03 — End: ?

## 2022-11-04 ENCOUNTER — Ambulatory Visit: Admit: 2022-11-04 | Discharge: 2022-11-05 | Payer: MEDICARE

## 2022-11-04 DIAGNOSIS — D469 Myelodysplastic syndrome, unspecified: Principal | ICD-10-CM

## 2022-11-04 DIAGNOSIS — Z5111 Encounter for antineoplastic chemotherapy: Secondary | ICD-10-CM | POA: Diagnosis not present

## 2022-11-05 ENCOUNTER — Ambulatory Visit: Admit: 2022-11-05 | Discharge: 2022-11-05 | Payer: MEDICARE

## 2022-11-05 ENCOUNTER — Other Ambulatory Visit: Admit: 2022-11-05 | Discharge: 2022-11-05 | Payer: MEDICARE

## 2022-11-05 DIAGNOSIS — D469 Myelodysplastic syndrome, unspecified: Principal | ICD-10-CM

## 2022-11-05 DIAGNOSIS — D61818 Other pancytopenia: Principal | ICD-10-CM

## 2022-11-05 DIAGNOSIS — Z5111 Encounter for antineoplastic chemotherapy: Secondary | ICD-10-CM | POA: Diagnosis not present

## 2022-11-05 DIAGNOSIS — Z79899 Other long term (current) drug therapy: Secondary | ICD-10-CM | POA: Diagnosis not present

## 2022-11-05 DIAGNOSIS — H5203 Hypermetropia, bilateral: Secondary | ICD-10-CM | POA: Diagnosis not present

## 2022-11-05 DIAGNOSIS — H401131 Primary open-angle glaucoma, bilateral, mild stage: Secondary | ICD-10-CM | POA: Diagnosis not present

## 2022-11-06 ENCOUNTER — Ambulatory Visit: Admit: 2022-11-06 | Discharge: 2022-11-07 | Payer: MEDICARE

## 2022-11-06 DIAGNOSIS — D469 Myelodysplastic syndrome, unspecified: Principal | ICD-10-CM

## 2022-11-09 ENCOUNTER — Ambulatory Visit: Admit: 2022-11-09 | Discharge: 2022-11-09 | Payer: MEDICARE

## 2022-11-09 DIAGNOSIS — D61818 Other pancytopenia: Principal | ICD-10-CM

## 2022-11-09 DIAGNOSIS — D469 Myelodysplastic syndrome, unspecified: Principal | ICD-10-CM

## 2022-11-09 DIAGNOSIS — Z5111 Encounter for antineoplastic chemotherapy: Secondary | ICD-10-CM | POA: Diagnosis not present

## 2022-11-10 ENCOUNTER — Ambulatory Visit: Admit: 2022-11-10 | Discharge: 2022-11-11 | Payer: MEDICARE

## 2022-11-10 DIAGNOSIS — D469 Myelodysplastic syndrome, unspecified: Principal | ICD-10-CM

## 2022-11-10 DIAGNOSIS — Z5111 Encounter for antineoplastic chemotherapy: Secondary | ICD-10-CM | POA: Diagnosis not present

## 2022-11-11 ENCOUNTER — Ambulatory Visit: Admit: 2022-11-11 | Discharge: 2022-11-12 | Payer: MEDICARE

## 2022-11-11 ENCOUNTER — Encounter: Admit: 2022-11-11 | Discharge: 2022-11-12 | Payer: MEDICARE

## 2022-11-11 ENCOUNTER — Other Ambulatory Visit: Admit: 2022-11-11 | Discharge: 2022-11-12 | Payer: MEDICARE

## 2022-11-11 DIAGNOSIS — D469 Myelodysplastic syndrome, unspecified: Principal | ICD-10-CM

## 2022-11-11 DIAGNOSIS — D61818 Other pancytopenia: Principal | ICD-10-CM

## 2022-11-11 DIAGNOSIS — Z5111 Encounter for antineoplastic chemotherapy: Secondary | ICD-10-CM | POA: Diagnosis not present

## 2022-11-12 ENCOUNTER — Ambulatory Visit: Admit: 2022-11-12 | Discharge: 2022-11-12 | Payer: MEDICARE

## 2022-11-12 ENCOUNTER — Institutional Professional Consult (permissible substitution): Admit: 2022-11-12 | Discharge: 2022-11-12 | Payer: MEDICARE

## 2022-11-12 DIAGNOSIS — D469 Myelodysplastic syndrome, unspecified: Principal | ICD-10-CM

## 2022-11-14 ENCOUNTER — Other Ambulatory Visit: Payer: Self-pay | Admitting: Internal Medicine

## 2022-11-16 ENCOUNTER — Encounter
Admit: 2022-11-16 | Discharge: 2022-11-16 | Payer: MEDICARE | Attending: Hematology & Oncology | Primary: Hematology & Oncology

## 2022-11-16 ENCOUNTER — Institutional Professional Consult (permissible substitution): Admit: 2022-11-16 | Discharge: 2022-11-17 | Payer: MEDICARE

## 2022-11-16 ENCOUNTER — Encounter: Admit: 2022-11-16 | Discharge: 2022-11-17 | Payer: MEDICARE

## 2022-11-16 ENCOUNTER — Other Ambulatory Visit: Admit: 2022-11-16 | Discharge: 2022-11-17 | Payer: MEDICARE

## 2022-11-16 DIAGNOSIS — D61818 Other pancytopenia: Principal | ICD-10-CM

## 2022-11-16 DIAGNOSIS — D469 Myelodysplastic syndrome, unspecified: Principal | ICD-10-CM

## 2022-11-16 DIAGNOSIS — D472 Monoclonal gammopathy: Secondary | ICD-10-CM | POA: Diagnosis not present

## 2022-11-16 MED ORDER — FLECAINIDE 50 MG TABLET
ORAL_TABLET | Freq: Two times a day (BID) | ORAL | 0 refills | 30 days | Status: CP
Start: 2022-11-16 — End: ?

## 2022-11-17 ENCOUNTER — Ambulatory Visit: Admit: 2022-11-17 | Discharge: 2022-11-18 | Payer: MEDICARE

## 2022-11-17 DIAGNOSIS — D469 Myelodysplastic syndrome, unspecified: Principal | ICD-10-CM

## 2022-11-17 DIAGNOSIS — D61818 Other pancytopenia: Principal | ICD-10-CM

## 2022-11-23 ENCOUNTER — Other Ambulatory Visit: Admit: 2022-11-23 | Discharge: 2022-11-24 | Payer: MEDICARE

## 2022-11-23 ENCOUNTER — Institutional Professional Consult (permissible substitution): Admit: 2022-11-23 | Discharge: 2022-11-24 | Payer: MEDICARE

## 2022-11-23 DIAGNOSIS — D469 Myelodysplastic syndrome, unspecified: Principal | ICD-10-CM

## 2022-11-23 DIAGNOSIS — D61818 Other pancytopenia: Principal | ICD-10-CM

## 2022-11-25 ENCOUNTER — Ambulatory Visit: Admit: 2022-11-25 | Discharge: 2022-11-26 | Payer: MEDICARE

## 2022-11-25 DIAGNOSIS — D61818 Other pancytopenia: Secondary | ICD-10-CM | POA: Diagnosis not present

## 2022-11-25 DIAGNOSIS — Z9181 History of falling: Secondary | ICD-10-CM | POA: Diagnosis not present

## 2022-11-25 DIAGNOSIS — G603 Idiopathic progressive neuropathy: Secondary | ICD-10-CM | POA: Diagnosis not present

## 2022-11-25 DIAGNOSIS — D469 Myelodysplastic syndrome, unspecified: Secondary | ICD-10-CM | POA: Diagnosis not present

## 2022-11-25 DIAGNOSIS — R519 Headache, unspecified: Secondary | ICD-10-CM | POA: Diagnosis not present

## 2022-11-25 DIAGNOSIS — M5430 Sciatica, unspecified side: Secondary | ICD-10-CM | POA: Diagnosis not present

## 2022-11-25 DIAGNOSIS — M79605 Pain in left leg: Secondary | ICD-10-CM | POA: Diagnosis not present

## 2022-11-25 DIAGNOSIS — M79604 Pain in right leg: Secondary | ICD-10-CM | POA: Diagnosis not present

## 2022-11-25 DIAGNOSIS — Z789 Other specified health status: Secondary | ICD-10-CM | POA: Diagnosis not present

## 2022-11-26 ENCOUNTER — Ambulatory Visit
Admit: 2022-11-26 | Discharge: 2022-11-26 | Payer: MEDICARE | Attending: Student in an Organized Health Care Education/Training Program | Primary: Student in an Organized Health Care Education/Training Program

## 2022-11-26 ENCOUNTER — Other Ambulatory Visit: Admit: 2022-11-26 | Discharge: 2022-11-26 | Payer: MEDICARE

## 2022-11-26 ENCOUNTER — Encounter
Admit: 2022-11-26 | Discharge: 2022-11-26 | Payer: MEDICARE | Attending: Student in an Organized Health Care Education/Training Program | Primary: Student in an Organized Health Care Education/Training Program

## 2022-11-26 DIAGNOSIS — D469 Myelodysplastic syndrome, unspecified: Principal | ICD-10-CM

## 2022-11-26 DIAGNOSIS — D61818 Other pancytopenia: Principal | ICD-10-CM

## 2022-11-26 DIAGNOSIS — G44209 Tension-type headache, unspecified, not intractable: Secondary | ICD-10-CM | POA: Diagnosis not present

## 2022-11-26 DIAGNOSIS — Z7901 Long term (current) use of anticoagulants: Secondary | ICD-10-CM | POA: Diagnosis not present

## 2022-11-26 DIAGNOSIS — I4891 Unspecified atrial fibrillation: Secondary | ICD-10-CM | POA: Diagnosis not present

## 2022-11-26 DIAGNOSIS — E7849 Other hyperlipidemia: Secondary | ICD-10-CM | POA: Diagnosis not present

## 2022-11-26 DIAGNOSIS — I483 Typical atrial flutter: Secondary | ICD-10-CM | POA: Diagnosis not present

## 2022-11-26 DIAGNOSIS — M533 Sacrococcygeal disorders, not elsewhere classified: Secondary | ICD-10-CM | POA: Diagnosis not present

## 2022-11-26 DIAGNOSIS — Z7982 Long term (current) use of aspirin: Secondary | ICD-10-CM | POA: Diagnosis not present

## 2022-11-26 DIAGNOSIS — R5383 Other fatigue: Secondary | ICD-10-CM | POA: Diagnosis not present

## 2022-11-26 MED ORDER — VALACYCLOVIR 500 MG TABLET
ORAL_TABLET | Freq: Every day | ORAL | 0 refills | 30 days | Status: CP
Start: 2022-11-26 — End: 2022-12-26

## 2022-11-26 MED ORDER — LEVOFLOXACIN 500 MG TABLET
ORAL_TABLET | Freq: Every day | ORAL | 0 refills | 30 days | Status: CP
Start: 2022-11-26 — End: 2022-12-26

## 2022-11-29 DIAGNOSIS — E876 Hypokalemia: Principal | ICD-10-CM

## 2022-11-29 MED ORDER — POTASSIUM CHLORIDE ER 10 MEQ TABLET,EXTENDED RELEASE(PART/CRYST)
ORAL_TABLET | Freq: Every day | ORAL | 0 refills | 0 days
Start: 2022-11-29 — End: 2023-11-29

## 2022-11-30 ENCOUNTER — Other Ambulatory Visit: Admit: 2022-11-30 | Discharge: 2022-11-30 | Payer: MEDICARE

## 2022-11-30 ENCOUNTER — Ambulatory Visit: Admit: 2022-11-30 | Discharge: 2022-11-30 | Payer: MEDICARE

## 2022-11-30 ENCOUNTER — Encounter: Admit: 2022-11-30 | Discharge: 2022-11-30 | Payer: MEDICARE

## 2022-11-30 ENCOUNTER — Ambulatory Visit
Admit: 2022-11-30 | Discharge: 2022-11-30 | Payer: MEDICARE | Attending: Hematology & Oncology | Primary: Hematology & Oncology

## 2022-11-30 DIAGNOSIS — D469 Myelodysplastic syndrome, unspecified: Principal | ICD-10-CM

## 2022-11-30 DIAGNOSIS — D61818 Other pancytopenia: Principal | ICD-10-CM

## 2022-11-30 DIAGNOSIS — D0511 Intraductal carcinoma in situ of right breast: Principal | ICD-10-CM

## 2022-11-30 DIAGNOSIS — Z7982 Long term (current) use of aspirin: Secondary | ICD-10-CM | POA: Diagnosis not present

## 2022-11-30 DIAGNOSIS — Z5111 Encounter for antineoplastic chemotherapy: Secondary | ICD-10-CM | POA: Diagnosis not present

## 2022-12-01 ENCOUNTER — Ambulatory Visit: Admit: 2022-12-01 | Discharge: 2022-12-02 | Payer: MEDICARE

## 2022-12-01 DIAGNOSIS — D469 Myelodysplastic syndrome, unspecified: Principal | ICD-10-CM

## 2022-12-01 DIAGNOSIS — Z5111 Encounter for antineoplastic chemotherapy: Secondary | ICD-10-CM | POA: Diagnosis not present

## 2022-12-01 MED ORDER — POTASSIUM CHLORIDE ER 10 MEQ TABLET,EXTENDED RELEASE(PART/CRYST)
ORAL_TABLET | 0 refills | 0 days | Status: CP
Start: 2022-12-01 — End: ?

## 2022-12-02 ENCOUNTER — Ambulatory Visit: Admit: 2022-12-02 | Discharge: 2022-12-03 | Payer: MEDICARE

## 2022-12-02 DIAGNOSIS — D61818 Other pancytopenia: Principal | ICD-10-CM

## 2022-12-02 DIAGNOSIS — D469 Myelodysplastic syndrome, unspecified: Principal | ICD-10-CM

## 2022-12-02 DIAGNOSIS — Z5111 Encounter for antineoplastic chemotherapy: Secondary | ICD-10-CM | POA: Diagnosis not present

## 2022-12-03 ENCOUNTER — Other Ambulatory Visit: Admit: 2022-12-03 | Discharge: 2022-12-03 | Payer: MEDICARE

## 2022-12-03 ENCOUNTER — Ambulatory Visit: Admit: 2022-12-03 | Discharge: 2022-12-03 | Payer: MEDICARE

## 2022-12-03 DIAGNOSIS — D61818 Other pancytopenia: Principal | ICD-10-CM

## 2022-12-03 DIAGNOSIS — D469 Myelodysplastic syndrome, unspecified: Principal | ICD-10-CM

## 2022-12-04 ENCOUNTER — Ambulatory Visit: Admit: 2022-12-04 | Discharge: 2022-12-05 | Payer: MEDICARE

## 2022-12-04 DIAGNOSIS — D469 Myelodysplastic syndrome, unspecified: Principal | ICD-10-CM

## 2022-12-04 DIAGNOSIS — Z5111 Encounter for antineoplastic chemotherapy: Secondary | ICD-10-CM | POA: Diagnosis not present

## 2022-12-08 ENCOUNTER — Ambulatory Visit: Admit: 2022-12-08 | Discharge: 2022-12-08 | Payer: MEDICARE

## 2022-12-08 ENCOUNTER — Other Ambulatory Visit: Admit: 2022-12-08 | Discharge: 2022-12-08 | Payer: MEDICARE

## 2022-12-08 DIAGNOSIS — D469 Myelodysplastic syndrome, unspecified: Principal | ICD-10-CM

## 2022-12-08 DIAGNOSIS — D61818 Other pancytopenia: Principal | ICD-10-CM

## 2022-12-08 DIAGNOSIS — Z5111 Encounter for antineoplastic chemotherapy: Secondary | ICD-10-CM | POA: Diagnosis not present

## 2022-12-09 ENCOUNTER — Ambulatory Visit: Admit: 2022-12-09 | Discharge: 2022-12-10 | Payer: MEDICARE

## 2022-12-09 DIAGNOSIS — D469 Myelodysplastic syndrome, unspecified: Secondary | ICD-10-CM | POA: Diagnosis not present

## 2022-12-11 ENCOUNTER — Ambulatory Visit
Admit: 2022-12-11 | Discharge: 2022-12-11 | Disposition: A | Discharge status: Home / Self Care | Payer: MEDICARE | Attending: Emergency Medicine

## 2022-12-11 ENCOUNTER — Emergency Department
Admit: 2022-12-11 | Discharge: 2022-12-11 | Disposition: A | Discharge status: Home / Self Care | Payer: MEDICARE | Attending: Emergency Medicine

## 2022-12-11 DIAGNOSIS — R091 Pleurisy: Principal | ICD-10-CM

## 2022-12-11 DIAGNOSIS — E7849 Other hyperlipidemia: Secondary | ICD-10-CM | POA: Diagnosis not present

## 2022-12-11 DIAGNOSIS — Z888 Allergy status to other drugs, medicaments and biological substances status: Secondary | ICD-10-CM | POA: Diagnosis not present

## 2022-12-11 DIAGNOSIS — Z79899 Other long term (current) drug therapy: Secondary | ICD-10-CM | POA: Diagnosis not present

## 2022-12-11 DIAGNOSIS — M1990 Unspecified osteoarthritis, unspecified site: Secondary | ICD-10-CM | POA: Diagnosis not present

## 2022-12-11 DIAGNOSIS — K219 Gastro-esophageal reflux disease without esophagitis: Secondary | ICD-10-CM | POA: Diagnosis not present

## 2022-12-11 DIAGNOSIS — I4892 Unspecified atrial flutter: Secondary | ICD-10-CM | POA: Diagnosis not present

## 2022-12-11 DIAGNOSIS — I6523 Occlusion and stenosis of bilateral carotid arteries: Secondary | ICD-10-CM | POA: Diagnosis not present

## 2022-12-11 DIAGNOSIS — R079 Chest pain, unspecified: Secondary | ICD-10-CM | POA: Diagnosis not present

## 2022-12-11 DIAGNOSIS — D469 Myelodysplastic syndrome, unspecified: Secondary | ICD-10-CM | POA: Diagnosis not present

## 2022-12-14 ENCOUNTER — Institutional Professional Consult (permissible substitution): Admit: 2022-12-14 | Discharge: 2022-12-14 | Payer: MEDICARE

## 2022-12-14 ENCOUNTER — Other Ambulatory Visit: Admit: 2022-12-14 | Discharge: 2022-12-14 | Payer: MEDICARE

## 2022-12-14 ENCOUNTER — Encounter: Admit: 2022-12-14 | Discharge: 2022-12-14 | Payer: MEDICARE

## 2022-12-14 DIAGNOSIS — D61818 Other pancytopenia: Principal | ICD-10-CM

## 2022-12-14 DIAGNOSIS — D469 Myelodysplastic syndrome, unspecified: Principal | ICD-10-CM

## 2022-12-14 DIAGNOSIS — D539 Nutritional anemia, unspecified: Principal | ICD-10-CM

## 2022-12-15 ENCOUNTER — Ambulatory Visit: Admit: 2022-12-15 | Discharge: 2022-12-16 | Payer: MEDICARE

## 2022-12-15 DIAGNOSIS — R0789 Other chest pain: Principal | ICD-10-CM

## 2022-12-15 DIAGNOSIS — F32A Depression, unspecified: Secondary | ICD-10-CM | POA: Diagnosis not present

## 2022-12-15 DIAGNOSIS — F419 Anxiety disorder, unspecified: Secondary | ICD-10-CM | POA: Diagnosis not present

## 2022-12-15 MED ORDER — TRAMADOL 50 MG TABLET
ORAL_TABLET | Freq: Three times a day (TID) | ORAL | 0 refills | 4 days | Status: CP | PRN
Start: 2022-12-15 — End: ?

## 2022-12-15 MED ORDER — TIZANIDINE 2 MG TABLET
ORAL_TABLET | Freq: Four times a day (QID) | ORAL | 0 refills | 8 days | Status: CP | PRN
Start: 2022-12-15 — End: ?

## 2022-12-16 ENCOUNTER — Ambulatory Visit: Admit: 2022-12-16 | Discharge: 2022-12-16 | Payer: MEDICARE

## 2022-12-16 ENCOUNTER — Other Ambulatory Visit: Admit: 2022-12-16 | Discharge: 2022-12-16 | Payer: MEDICARE

## 2022-12-16 ENCOUNTER — Encounter: Admit: 2022-12-16 | Discharge: 2022-12-16 | Payer: MEDICARE

## 2022-12-16 DIAGNOSIS — D469 Myelodysplastic syndrome, unspecified: Principal | ICD-10-CM

## 2022-12-16 DIAGNOSIS — D61818 Other pancytopenia: Principal | ICD-10-CM

## 2022-12-16 DIAGNOSIS — D539 Nutritional anemia, unspecified: Principal | ICD-10-CM

## 2022-12-21 ENCOUNTER — Institutional Professional Consult (permissible substitution): Admit: 2022-12-21 | Discharge: 2022-12-21 | Payer: MEDICARE

## 2022-12-21 ENCOUNTER — Other Ambulatory Visit: Admit: 2022-12-21 | Discharge: 2022-12-21 | Payer: MEDICARE

## 2022-12-21 DIAGNOSIS — D61818 Other pancytopenia: Secondary | ICD-10-CM | POA: Diagnosis not present

## 2022-12-21 DIAGNOSIS — D539 Nutritional anemia, unspecified: Secondary | ICD-10-CM | POA: Diagnosis not present

## 2022-12-21 DIAGNOSIS — D469 Myelodysplastic syndrome, unspecified: Secondary | ICD-10-CM | POA: Diagnosis not present

## 2022-12-22 ENCOUNTER — Other Ambulatory Visit: Admit: 2022-12-22 | Discharge: 2022-12-23 | Payer: MEDICARE

## 2022-12-22 ENCOUNTER — Ambulatory Visit
Admit: 2022-12-22 | Discharge: 2022-12-23 | Payer: MEDICARE | Attending: Student in an Organized Health Care Education/Training Program | Primary: Student in an Organized Health Care Education/Training Program

## 2022-12-22 DIAGNOSIS — D61818 Other pancytopenia: Secondary | ICD-10-CM | POA: Diagnosis not present

## 2022-12-28 ENCOUNTER — Ambulatory Visit
Admit: 2022-12-28 | Discharge: 2022-12-28 | Payer: MEDICARE | Attending: Hematology & Oncology | Primary: Hematology & Oncology

## 2022-12-28 ENCOUNTER — Ambulatory Visit: Admit: 2022-12-28 | Discharge: 2022-12-28 | Payer: MEDICARE

## 2022-12-28 ENCOUNTER — Encounter
Admit: 2022-12-28 | Discharge: 2022-12-28 | Payer: MEDICARE | Attending: Hematology & Oncology | Primary: Hematology & Oncology

## 2022-12-28 ENCOUNTER — Other Ambulatory Visit: Admit: 2022-12-28 | Discharge: 2022-12-28 | Payer: MEDICARE

## 2022-12-28 DIAGNOSIS — D469 Myelodysplastic syndrome, unspecified: Principal | ICD-10-CM

## 2022-12-28 DIAGNOSIS — D61818 Other pancytopenia: Principal | ICD-10-CM

## 2022-12-28 DIAGNOSIS — K219 Gastro-esophageal reflux disease without esophagitis: Secondary | ICD-10-CM | POA: Diagnosis not present

## 2022-12-28 DIAGNOSIS — E7849 Other hyperlipidemia: Secondary | ICD-10-CM | POA: Diagnosis not present

## 2022-12-28 DIAGNOSIS — Z79810 Long term (current) use of selective estrogen receptor modulators (SERMs): Secondary | ICD-10-CM | POA: Diagnosis not present

## 2022-12-28 DIAGNOSIS — Z5111 Encounter for antineoplastic chemotherapy: Secondary | ICD-10-CM | POA: Diagnosis not present

## 2022-12-28 DIAGNOSIS — Z79899 Other long term (current) drug therapy: Secondary | ICD-10-CM | POA: Diagnosis not present

## 2022-12-28 DIAGNOSIS — Z853 Personal history of malignant neoplasm of breast: Secondary | ICD-10-CM | POA: Diagnosis not present

## 2022-12-29 ENCOUNTER — Ambulatory Visit: Admit: 2022-12-29 | Discharge: 2022-12-30 | Payer: MEDICARE

## 2022-12-29 DIAGNOSIS — D61818 Other pancytopenia: Principal | ICD-10-CM

## 2022-12-29 DIAGNOSIS — D469 Myelodysplastic syndrome, unspecified: Principal | ICD-10-CM

## 2022-12-29 DIAGNOSIS — Z5111 Encounter for antineoplastic chemotherapy: Secondary | ICD-10-CM | POA: Diagnosis not present

## 2022-12-29 MED ORDER — LEVOFLOXACIN 500 MG TABLET
ORAL_TABLET | Freq: Every day | ORAL | 0 refills | 30 days | Status: CP
Start: 2022-12-29 — End: ?

## 2022-12-30 ENCOUNTER — Ambulatory Visit: Admit: 2022-12-30 | Discharge: 2022-12-30 | Payer: MEDICARE

## 2022-12-30 DIAGNOSIS — D469 Myelodysplastic syndrome, unspecified: Principal | ICD-10-CM

## 2022-12-30 DIAGNOSIS — Z5111 Encounter for antineoplastic chemotherapy: Secondary | ICD-10-CM | POA: Diagnosis not present

## 2022-12-31 ENCOUNTER — Ambulatory Visit: Admit: 2022-12-31 | Discharge: 2022-12-31 | Payer: MEDICARE

## 2022-12-31 DIAGNOSIS — D469 Myelodysplastic syndrome, unspecified: Principal | ICD-10-CM

## 2022-12-31 DIAGNOSIS — Z5111 Encounter for antineoplastic chemotherapy: Secondary | ICD-10-CM | POA: Diagnosis not present

## 2023-01-01 ENCOUNTER — Ambulatory Visit: Admit: 2023-01-01 | Discharge: 2023-01-02 | Payer: MEDICARE

## 2023-01-01 DIAGNOSIS — D469 Myelodysplastic syndrome, unspecified: Principal | ICD-10-CM

## 2023-01-04 ENCOUNTER — Ambulatory Visit: Admit: 2023-01-04 | Discharge: 2023-01-05 | Payer: MEDICARE

## 2023-01-04 DIAGNOSIS — D469 Myelodysplastic syndrome, unspecified: Principal | ICD-10-CM

## 2023-01-04 DIAGNOSIS — Z5111 Encounter for antineoplastic chemotherapy: Secondary | ICD-10-CM | POA: Diagnosis not present

## 2023-01-05 ENCOUNTER — Ambulatory Visit: Admit: 2023-01-05 | Discharge: 2023-01-05 | Payer: MEDICARE

## 2023-01-05 DIAGNOSIS — D469 Myelodysplastic syndrome, unspecified: Principal | ICD-10-CM

## 2023-01-05 DIAGNOSIS — Z5111 Encounter for antineoplastic chemotherapy: Secondary | ICD-10-CM | POA: Diagnosis not present

## 2023-01-08 ENCOUNTER — Other Ambulatory Visit: Admit: 2023-01-08 | Discharge: 2023-01-09 | Payer: MEDICARE

## 2023-01-08 ENCOUNTER — Institutional Professional Consult (permissible substitution): Admit: 2023-01-08 | Discharge: 2023-01-09 | Payer: MEDICARE

## 2023-01-08 DIAGNOSIS — D539 Nutritional anemia, unspecified: Secondary | ICD-10-CM | POA: Diagnosis not present

## 2023-01-08 DIAGNOSIS — D469 Myelodysplastic syndrome, unspecified: Secondary | ICD-10-CM | POA: Diagnosis not present

## 2023-01-08 DIAGNOSIS — D61818 Other pancytopenia: Secondary | ICD-10-CM | POA: Diagnosis not present

## 2023-01-11 ENCOUNTER — Institutional Professional Consult (permissible substitution): Admit: 2023-01-11 | Discharge: 2023-01-11 | Payer: MEDICARE

## 2023-01-11 ENCOUNTER — Other Ambulatory Visit: Admit: 2023-01-11 | Discharge: 2023-01-11 | Payer: MEDICARE

## 2023-01-11 DIAGNOSIS — D61818 Other pancytopenia: Principal | ICD-10-CM

## 2023-01-11 DIAGNOSIS — D469 Myelodysplastic syndrome, unspecified: Principal | ICD-10-CM

## 2023-01-15 ENCOUNTER — Other Ambulatory Visit: Payer: Self-pay | Admitting: Internal Medicine

## 2023-01-18 ENCOUNTER — Other Ambulatory Visit: Admit: 2023-01-18 | Discharge: 2023-01-18 | Payer: MEDICARE

## 2023-01-18 ENCOUNTER — Institutional Professional Consult (permissible substitution): Admit: 2023-01-18 | Discharge: 2023-01-18 | Payer: MEDICARE

## 2023-01-18 DIAGNOSIS — D469 Myelodysplastic syndrome, unspecified: Principal | ICD-10-CM

## 2023-01-18 DIAGNOSIS — D61818 Other pancytopenia: Principal | ICD-10-CM

## 2023-01-19 ENCOUNTER — Other Ambulatory Visit: Admit: 2023-01-19 | Discharge: 2023-01-19 | Payer: MEDICARE

## 2023-01-19 ENCOUNTER — Ambulatory Visit: Admit: 2023-01-19 | Discharge: 2023-01-19 | Payer: MEDICARE | Attending: Adult Health | Primary: Adult Health

## 2023-01-19 ENCOUNTER — Encounter: Admit: 2023-01-19 | Discharge: 2023-01-19 | Payer: MEDICARE | Attending: Adult Health | Primary: Adult Health

## 2023-01-19 DIAGNOSIS — D469 Myelodysplastic syndrome, unspecified: Principal | ICD-10-CM

## 2023-01-19 DIAGNOSIS — D61818 Other pancytopenia: Principal | ICD-10-CM

## 2023-01-19 DIAGNOSIS — Z01818 Encounter for other preprocedural examination: Principal | ICD-10-CM

## 2023-01-19 DIAGNOSIS — E7849 Other hyperlipidemia: Secondary | ICD-10-CM | POA: Diagnosis not present

## 2023-01-19 DIAGNOSIS — M542 Cervicalgia: Secondary | ICD-10-CM | POA: Diagnosis not present

## 2023-01-19 DIAGNOSIS — G44209 Tension-type headache, unspecified, not intractable: Secondary | ICD-10-CM | POA: Diagnosis not present

## 2023-01-19 DIAGNOSIS — Z7901 Long term (current) use of anticoagulants: Secondary | ICD-10-CM | POA: Diagnosis not present

## 2023-01-19 DIAGNOSIS — Z792 Long term (current) use of antibiotics: Secondary | ICD-10-CM | POA: Diagnosis not present

## 2023-01-19 DIAGNOSIS — I4891 Unspecified atrial fibrillation: Secondary | ICD-10-CM | POA: Diagnosis not present

## 2023-01-19 DIAGNOSIS — M79605 Pain in left leg: Secondary | ICD-10-CM | POA: Diagnosis not present

## 2023-01-19 MED ORDER — VALACYCLOVIR 500 MG TABLET
ORAL_TABLET | Freq: Every day | ORAL | 3 refills | 90 days | Status: CP
Start: 2023-01-19 — End: 2024-01-14

## 2023-01-21 ENCOUNTER — Institutional Professional Consult (permissible substitution): Admit: 2023-01-21 | Discharge: 2023-01-21 | Payer: MEDICARE

## 2023-01-21 ENCOUNTER — Other Ambulatory Visit: Admit: 2023-01-21 | Discharge: 2023-01-21 | Payer: MEDICARE

## 2023-01-21 DIAGNOSIS — D61818 Other pancytopenia: Principal | ICD-10-CM

## 2023-01-21 DIAGNOSIS — D469 Myelodysplastic syndrome, unspecified: Principal | ICD-10-CM

## 2023-01-25 ENCOUNTER — Ambulatory Visit: Admit: 2023-01-25 | Discharge: 2023-01-25 | Payer: MEDICARE

## 2023-01-25 ENCOUNTER — Ambulatory Visit
Admit: 2023-01-25 | Discharge: 2023-01-25 | Payer: MEDICARE | Attending: Nurse Practitioner | Primary: Nurse Practitioner

## 2023-01-25 ENCOUNTER — Other Ambulatory Visit: Admit: 2023-01-25 | Discharge: 2023-01-25 | Payer: MEDICARE

## 2023-01-25 DIAGNOSIS — D469 Myelodysplastic syndrome, unspecified: Principal | ICD-10-CM

## 2023-01-25 DIAGNOSIS — D61818 Other pancytopenia: Principal | ICD-10-CM

## 2023-01-25 DIAGNOSIS — Z5111 Encounter for antineoplastic chemotherapy: Secondary | ICD-10-CM | POA: Diagnosis not present

## 2023-01-26 ENCOUNTER — Ambulatory Visit: Admit: 2023-01-26 | Discharge: 2023-01-27 | Payer: MEDICARE

## 2023-01-26 DIAGNOSIS — D469 Myelodysplastic syndrome, unspecified: Principal | ICD-10-CM

## 2023-01-27 ENCOUNTER — Ambulatory Visit: Admit: 2023-01-27 | Discharge: 2023-01-27 | Payer: MEDICARE

## 2023-01-27 DIAGNOSIS — D469 Myelodysplastic syndrome, unspecified: Principal | ICD-10-CM

## 2023-01-27 DIAGNOSIS — Z7901 Long term (current) use of anticoagulants: Secondary | ICD-10-CM | POA: Diagnosis not present

## 2023-01-27 DIAGNOSIS — E861 Hypovolemia: Secondary | ICD-10-CM | POA: Diagnosis not present

## 2023-01-27 DIAGNOSIS — Z5111 Encounter for antineoplastic chemotherapy: Secondary | ICD-10-CM | POA: Diagnosis not present

## 2023-01-27 DIAGNOSIS — Z79891 Long term (current) use of opiate analgesic: Secondary | ICD-10-CM | POA: Diagnosis not present

## 2023-01-27 DIAGNOSIS — R519 Headache, unspecified: Secondary | ICD-10-CM | POA: Diagnosis not present

## 2023-01-27 DIAGNOSIS — Z885 Allergy status to narcotic agent status: Secondary | ICD-10-CM | POA: Diagnosis not present

## 2023-01-27 DIAGNOSIS — Z79810 Long term (current) use of selective estrogen receptor modulators (SERMs): Secondary | ICD-10-CM | POA: Diagnosis not present

## 2023-01-27 DIAGNOSIS — D61818 Other pancytopenia: Secondary | ICD-10-CM | POA: Diagnosis not present

## 2023-01-28 ENCOUNTER — Ambulatory Visit: Admit: 2023-01-28 | Discharge: 2023-01-29 | Payer: MEDICARE

## 2023-01-28 DIAGNOSIS — D469 Myelodysplastic syndrome, unspecified: Principal | ICD-10-CM

## 2023-01-28 DIAGNOSIS — Z5111 Encounter for antineoplastic chemotherapy: Secondary | ICD-10-CM | POA: Diagnosis not present

## 2023-01-29 ENCOUNTER — Ambulatory Visit: Admit: 2023-01-29 | Discharge: 2023-01-30 | Payer: MEDICARE

## 2023-01-29 DIAGNOSIS — E7849 Other hyperlipidemia: Principal | ICD-10-CM

## 2023-01-29 DIAGNOSIS — D469 Myelodysplastic syndrome, unspecified: Principal | ICD-10-CM

## 2023-01-29 DIAGNOSIS — R0602 Shortness of breath: Principal | ICD-10-CM

## 2023-01-29 DIAGNOSIS — I480 Paroxysmal atrial fibrillation: Principal | ICD-10-CM

## 2023-01-29 DIAGNOSIS — I6523 Occlusion and stenosis of bilateral carotid arteries: Principal | ICD-10-CM

## 2023-01-29 DIAGNOSIS — Z79899 Other long term (current) drug therapy: Secondary | ICD-10-CM | POA: Diagnosis not present

## 2023-02-01 ENCOUNTER — Ambulatory Visit: Admit: 2023-02-01 | Discharge: 2023-02-02 | Payer: MEDICARE

## 2023-02-01 DIAGNOSIS — D469 Myelodysplastic syndrome, unspecified: Principal | ICD-10-CM

## 2023-02-02 ENCOUNTER — Encounter: Admit: 2023-02-02 | Discharge: 2023-02-03 | Payer: MEDICARE

## 2023-02-02 ENCOUNTER — Ambulatory Visit: Admit: 2023-02-02 | Discharge: 2023-02-03 | Payer: MEDICARE

## 2023-02-02 ENCOUNTER — Other Ambulatory Visit: Admit: 2023-02-02 | Discharge: 2023-02-03 | Payer: MEDICARE

## 2023-02-02 DIAGNOSIS — D469 Myelodysplastic syndrome, unspecified: Principal | ICD-10-CM

## 2023-02-02 DIAGNOSIS — D61818 Other pancytopenia: Principal | ICD-10-CM

## 2023-02-02 DIAGNOSIS — Z5111 Encounter for antineoplastic chemotherapy: Secondary | ICD-10-CM | POA: Diagnosis not present

## 2023-02-03 ENCOUNTER — Other Ambulatory Visit: Payer: Self-pay | Admitting: Internal Medicine

## 2023-02-08 ENCOUNTER — Other Ambulatory Visit: Admit: 2023-02-08 | Discharge: 2023-02-08 | Payer: MEDICARE

## 2023-02-08 ENCOUNTER — Institutional Professional Consult (permissible substitution): Admit: 2023-02-08 | Discharge: 2023-02-08 | Payer: MEDICARE

## 2023-02-08 DIAGNOSIS — D509 Iron deficiency anemia, unspecified: Principal | ICD-10-CM

## 2023-02-08 DIAGNOSIS — D469 Myelodysplastic syndrome, unspecified: Principal | ICD-10-CM

## 2023-02-08 DIAGNOSIS — D61818 Other pancytopenia: Principal | ICD-10-CM

## 2023-02-08 DIAGNOSIS — D0511 Intraductal carcinoma in situ of right breast: Principal | ICD-10-CM

## 2023-02-11 ENCOUNTER — Ambulatory Visit: Admit: 2023-02-11 | Discharge: 2023-02-11 | Payer: MEDICARE | Attending: Adult Health | Primary: Adult Health

## 2023-02-11 ENCOUNTER — Other Ambulatory Visit: Admit: 2023-02-11 | Discharge: 2023-02-11 | Payer: MEDICARE

## 2023-02-11 ENCOUNTER — Encounter: Admit: 2023-02-11 | Discharge: 2023-02-11 | Payer: MEDICARE

## 2023-02-11 ENCOUNTER — Encounter: Admit: 2023-02-11 | Discharge: 2023-02-11 | Payer: MEDICARE | Attending: Adult Health | Primary: Adult Health

## 2023-02-11 DIAGNOSIS — D61818 Other pancytopenia: Principal | ICD-10-CM

## 2023-02-11 DIAGNOSIS — D469 Myelodysplastic syndrome, unspecified: Principal | ICD-10-CM

## 2023-02-15 ENCOUNTER — Institutional Professional Consult (permissible substitution): Admit: 2023-02-15 | Discharge: 2023-02-15 | Payer: MEDICARE

## 2023-02-15 ENCOUNTER — Encounter: Admit: 2023-02-15 | Discharge: 2023-02-15 | Payer: MEDICARE

## 2023-02-15 ENCOUNTER — Other Ambulatory Visit: Admit: 2023-02-15 | Discharge: 2023-02-15 | Payer: MEDICARE

## 2023-02-15 DIAGNOSIS — D469 Myelodysplastic syndrome, unspecified: Principal | ICD-10-CM

## 2023-02-15 DIAGNOSIS — D61818 Other pancytopenia: Principal | ICD-10-CM

## 2023-02-17 DIAGNOSIS — D472 Monoclonal gammopathy: Principal | ICD-10-CM

## 2023-02-18 ENCOUNTER — Ambulatory Visit: Admit: 2023-02-18 | Discharge: 2023-02-18 | Payer: MEDICARE

## 2023-02-18 DIAGNOSIS — D469 Myelodysplastic syndrome, unspecified: Principal | ICD-10-CM

## 2023-02-18 DIAGNOSIS — D61818 Other pancytopenia: Principal | ICD-10-CM

## 2023-02-19 ENCOUNTER — Other Ambulatory Visit: Payer: Self-pay | Admitting: Internal Medicine

## 2023-02-19 DIAGNOSIS — D61818 Other pancytopenia: Principal | ICD-10-CM

## 2023-02-19 MED ORDER — LEVOFLOXACIN 500 MG TABLET
ORAL_TABLET | Freq: Every day | ORAL | 0 refills | 0 days
Start: 2023-02-19 — End: ?

## 2023-02-22 ENCOUNTER — Ambulatory Visit
Admit: 2023-02-22 | Discharge: 2023-02-23 | Payer: MEDICARE | Attending: Student in an Organized Health Care Education/Training Program | Primary: Student in an Organized Health Care Education/Training Program

## 2023-02-22 ENCOUNTER — Other Ambulatory Visit: Admit: 2023-02-22 | Discharge: 2023-02-23 | Payer: MEDICARE

## 2023-02-22 DIAGNOSIS — D469 Myelodysplastic syndrome, unspecified: Principal | ICD-10-CM

## 2023-02-22 DIAGNOSIS — D472 Monoclonal gammopathy: Principal | ICD-10-CM

## 2023-02-22 DIAGNOSIS — G44209 Tension-type headache, unspecified, not intractable: Principal | ICD-10-CM

## 2023-02-22 MED ORDER — LEVOFLOXACIN 500 MG TABLET
ORAL_TABLET | Freq: Every day | ORAL | 0 refills | 30 days | Status: CP
Start: 2023-02-22 — End: ?

## 2023-03-01 ENCOUNTER — Ambulatory Visit: Admit: 2023-03-01 | Discharge: 2023-03-01 | Payer: MEDICARE

## 2023-03-01 DIAGNOSIS — D469 Myelodysplastic syndrome, unspecified: Principal | ICD-10-CM

## 2023-03-02 DIAGNOSIS — E876 Hypokalemia: Principal | ICD-10-CM

## 2023-03-02 MED ORDER — POTASSIUM CHLORIDE ER 10 MEQ TABLET,EXTENDED RELEASE(PART/CRYST)
ORAL_TABLET | Freq: Every day | ORAL | 1 refills | 90 days | Status: CP
Start: 2023-03-02 — End: ?

## 2023-03-03 ENCOUNTER — Other Ambulatory Visit: Payer: Self-pay | Admitting: Internal Medicine

## 2023-03-08 ENCOUNTER — Ambulatory Visit: Admit: 2023-03-08 | Discharge: 2023-03-08 | Payer: MEDICARE | Attending: Acute Care | Primary: Acute Care

## 2023-03-08 ENCOUNTER — Ambulatory Visit: Admit: 2023-03-08 | Discharge: 2023-03-08 | Payer: MEDICARE

## 2023-03-08 DIAGNOSIS — F32A Anxiety and depression: Principal | ICD-10-CM

## 2023-03-08 DIAGNOSIS — D469 Myelodysplastic syndrome, unspecified: Principal | ICD-10-CM

## 2023-03-08 DIAGNOSIS — F419 Anxiety disorder, unspecified: Principal | ICD-10-CM

## 2023-03-08 MED ORDER — FLECAINIDE 50 MG TABLET
ORAL_TABLET | Freq: Two times a day (BID) | ORAL | 3 refills | 90 days | Status: CP
Start: 2023-03-08 — End: ?

## 2023-03-08 MED ORDER — DULOXETINE 30 MG CAPSULE,DELAYED RELEASE
ORAL_CAPSULE | Freq: Every day | ORAL | 1 refills | 90 days
Start: 2023-03-08 — End: ?

## 2023-03-09 ENCOUNTER — Ambulatory Visit: Admit: 2023-03-09 | Discharge: 2023-03-10 | Payer: MEDICARE

## 2023-03-09 DIAGNOSIS — Z5111 Encounter for antineoplastic chemotherapy: Secondary | ICD-10-CM | POA: Diagnosis not present

## 2023-03-09 DIAGNOSIS — D469 Myelodysplastic syndrome, unspecified: Secondary | ICD-10-CM | POA: Diagnosis not present

## 2023-03-10 ENCOUNTER — Ambulatory Visit: Admit: 2023-03-10 | Discharge: 2023-03-11 | Payer: MEDICARE

## 2023-03-10 DIAGNOSIS — D469 Myelodysplastic syndrome, unspecified: Secondary | ICD-10-CM | POA: Diagnosis not present

## 2023-03-10 MED ORDER — DULOXETINE 30 MG CAPSULE,DELAYED RELEASE
ORAL_CAPSULE | Freq: Every day | ORAL | 1 refills | 90 days | Status: CP
Start: 2023-03-10 — End: ?

## 2023-03-11 ENCOUNTER — Other Ambulatory Visit: Admit: 2023-03-11 | Discharge: 2023-03-12 | Payer: MEDICARE

## 2023-03-11 ENCOUNTER — Encounter: Admit: 2023-03-11 | Discharge: 2023-03-12 | Payer: MEDICARE

## 2023-03-11 ENCOUNTER — Ambulatory Visit: Admit: 2023-03-11 | Discharge: 2023-03-12 | Payer: MEDICARE

## 2023-03-11 DIAGNOSIS — D61818 Other pancytopenia: Principal | ICD-10-CM

## 2023-03-11 DIAGNOSIS — D469 Myelodysplastic syndrome, unspecified: Principal | ICD-10-CM

## 2023-03-12 ENCOUNTER — Ambulatory Visit: Admit: 2023-03-12 | Discharge: 2023-03-13 | Payer: MEDICARE

## 2023-03-12 DIAGNOSIS — D469 Myelodysplastic syndrome, unspecified: Principal | ICD-10-CM

## 2023-03-12 DIAGNOSIS — D61818 Other pancytopenia: Principal | ICD-10-CM

## 2023-03-16 ENCOUNTER — Institutional Professional Consult (permissible substitution): Admit: 2023-03-16 | Discharge: 2023-03-16 | Payer: MEDICARE

## 2023-03-16 ENCOUNTER — Other Ambulatory Visit: Admit: 2023-03-16 | Discharge: 2023-03-16 | Payer: MEDICARE

## 2023-03-16 DIAGNOSIS — D469 Myelodysplastic syndrome, unspecified: Principal | ICD-10-CM

## 2023-03-16 DIAGNOSIS — D61818 Other pancytopenia: Principal | ICD-10-CM

## 2023-03-16 DIAGNOSIS — D649 Anemia, unspecified: Principal | ICD-10-CM

## 2023-03-18 ENCOUNTER — Institutional Professional Consult (permissible substitution): Admit: 2023-03-18 | Discharge: 2023-03-18 | Payer: MEDICARE

## 2023-03-18 ENCOUNTER — Encounter: Admit: 2023-03-18 | Discharge: 2023-03-18 | Payer: MEDICARE

## 2023-03-18 ENCOUNTER — Other Ambulatory Visit: Admit: 2023-03-18 | Discharge: 2023-03-18 | Payer: MEDICARE

## 2023-03-18 DIAGNOSIS — D469 Myelodysplastic syndrome, unspecified: Principal | ICD-10-CM

## 2023-03-18 DIAGNOSIS — D649 Anemia, unspecified: Principal | ICD-10-CM

## 2023-03-18 DIAGNOSIS — D61818 Other pancytopenia: Principal | ICD-10-CM

## 2023-03-19 ENCOUNTER — Ambulatory Visit: Admit: 2023-03-19 | Discharge: 2023-03-20 | Payer: MEDICARE

## 2023-03-19 DIAGNOSIS — D469 Myelodysplastic syndrome, unspecified: Principal | ICD-10-CM

## 2023-03-19 DIAGNOSIS — D61818 Other pancytopenia: Principal | ICD-10-CM

## 2023-03-22 ENCOUNTER — Other Ambulatory Visit: Admit: 2023-03-22 | Discharge: 2023-03-22 | Payer: MEDICARE

## 2023-03-22 ENCOUNTER — Institutional Professional Consult (permissible substitution): Admit: 2023-03-22 | Discharge: 2023-03-22 | Payer: MEDICARE

## 2023-03-22 DIAGNOSIS — D61818 Other pancytopenia: Principal | ICD-10-CM

## 2023-03-22 DIAGNOSIS — R42 Dizziness and giddiness: Principal | ICD-10-CM

## 2023-03-22 DIAGNOSIS — D469 Myelodysplastic syndrome, unspecified: Principal | ICD-10-CM

## 2023-03-22 DIAGNOSIS — D649 Anemia, unspecified: Principal | ICD-10-CM

## 2023-03-22 MED ORDER — MECLIZINE 25 MG TABLET
ORAL_TABLET | Freq: Three times a day (TID) | ORAL | 0 refills | 10 days | PRN
Start: 2023-03-22 — End: ?

## 2023-03-24 ENCOUNTER — Other Ambulatory Visit: Admit: 2023-03-24 | Discharge: 2023-03-24 | Payer: MEDICARE

## 2023-03-24 ENCOUNTER — Institutional Professional Consult (permissible substitution): Admit: 2023-03-24 | Discharge: 2023-03-24 | Payer: MEDICARE

## 2023-03-24 DIAGNOSIS — D649 Anemia, unspecified: Principal | ICD-10-CM

## 2023-03-24 DIAGNOSIS — D469 Myelodysplastic syndrome, unspecified: Principal | ICD-10-CM

## 2023-03-24 DIAGNOSIS — D61818 Other pancytopenia: Principal | ICD-10-CM

## 2023-03-25 MED ORDER — MECLIZINE 25 MG TABLET
ORAL_TABLET | Freq: Three times a day (TID) | ORAL | 0 refills | 10 days | Status: CP | PRN
Start: 2023-03-25 — End: ?

## 2023-03-26 ENCOUNTER — Encounter: Admit: 2023-03-26 | Discharge: 2023-03-26 | Payer: MEDICARE

## 2023-03-26 ENCOUNTER — Ambulatory Visit: Admit: 2023-03-26 | Discharge: 2023-03-26 | Payer: MEDICARE

## 2023-03-26 ENCOUNTER — Other Ambulatory Visit: Admit: 2023-03-26 | Discharge: 2023-03-26 | Payer: MEDICARE

## 2023-03-26 ENCOUNTER — Institutional Professional Consult (permissible substitution): Admit: 2023-03-26 | Discharge: 2023-03-26 | Payer: MEDICARE

## 2023-03-26 DIAGNOSIS — D61818 Other pancytopenia: Principal | ICD-10-CM

## 2023-03-26 DIAGNOSIS — D649 Anemia, unspecified: Principal | ICD-10-CM

## 2023-03-26 DIAGNOSIS — D469 Myelodysplastic syndrome, unspecified: Principal | ICD-10-CM

## 2023-03-29 ENCOUNTER — Encounter
Admit: 2023-03-29 | Discharge: 2023-03-29 | Payer: MEDICARE | Attending: Student in an Organized Health Care Education/Training Program | Primary: Student in an Organized Health Care Education/Training Program

## 2023-03-29 ENCOUNTER — Other Ambulatory Visit: Admit: 2023-03-29 | Discharge: 2023-03-29 | Payer: MEDICARE

## 2023-03-29 ENCOUNTER — Institutional Professional Consult (permissible substitution): Admit: 2023-03-29 | Discharge: 2023-03-29 | Payer: MEDICARE

## 2023-03-29 DIAGNOSIS — D469 Myelodysplastic syndrome, unspecified: Principal | ICD-10-CM

## 2023-03-29 DIAGNOSIS — D649 Anemia, unspecified: Principal | ICD-10-CM

## 2023-03-29 DIAGNOSIS — D61818 Other pancytopenia: Principal | ICD-10-CM

## 2023-03-30 ENCOUNTER — Other Ambulatory Visit: Admit: 2023-03-30 | Discharge: 2023-03-30 | Payer: MEDICARE

## 2023-03-30 ENCOUNTER — Ambulatory Visit
Admit: 2023-03-30 | Discharge: 2023-03-30 | Payer: MEDICARE | Attending: Student in an Organized Health Care Education/Training Program | Primary: Student in an Organized Health Care Education/Training Program

## 2023-03-30 ENCOUNTER — Ambulatory Visit: Admit: 2023-03-30 | Discharge: 2023-03-31 | Payer: MEDICARE

## 2023-03-30 DIAGNOSIS — D469 Myelodysplastic syndrome, unspecified: Principal | ICD-10-CM

## 2023-03-30 DIAGNOSIS — H409 Unspecified glaucoma: Principal | ICD-10-CM

## 2023-03-30 MED ORDER — LATANOPROST 0.005 % EYE DROPS
Freq: Every evening | OPHTHALMIC | 0 refills | 50 days | Status: CN
Start: 2023-03-30 — End: ?

## 2023-04-01 ENCOUNTER — Institutional Professional Consult (permissible substitution): Admit: 2023-04-01 | Discharge: 2023-04-01 | Payer: MEDICARE

## 2023-04-01 ENCOUNTER — Other Ambulatory Visit: Admit: 2023-04-01 | Discharge: 2023-04-01 | Payer: MEDICARE

## 2023-04-01 ENCOUNTER — Ambulatory Visit: Admit: 2023-04-01 | Discharge: 2023-04-01 | Payer: MEDICARE

## 2023-04-01 DIAGNOSIS — D61818 Other pancytopenia: Principal | ICD-10-CM

## 2023-04-01 DIAGNOSIS — D649 Anemia, unspecified: Principal | ICD-10-CM

## 2023-04-01 DIAGNOSIS — D469 Myelodysplastic syndrome, unspecified: Principal | ICD-10-CM

## 2023-04-01 MED ORDER — LEVOFLOXACIN 500 MG TABLET
ORAL_TABLET | Freq: Every day | ORAL | 0 refills | 30 days | Status: CP
Start: 2023-04-01 — End: ?

## 2023-04-02 ENCOUNTER — Encounter: Admit: 2023-04-02 | Discharge: 2023-04-02 | Payer: MEDICARE

## 2023-04-02 ENCOUNTER — Institutional Professional Consult (permissible substitution): Admit: 2023-04-02 | Discharge: 2023-04-02 | Payer: MEDICARE

## 2023-04-02 ENCOUNTER — Other Ambulatory Visit: Admit: 2023-04-02 | Discharge: 2023-04-02 | Payer: MEDICARE

## 2023-04-02 DIAGNOSIS — D469 Myelodysplastic syndrome, unspecified: Principal | ICD-10-CM

## 2023-04-02 DIAGNOSIS — D649 Anemia, unspecified: Principal | ICD-10-CM

## 2023-04-02 DIAGNOSIS — D61818 Other pancytopenia: Principal | ICD-10-CM

## 2023-04-05 ENCOUNTER — Ambulatory Visit: Admit: 2023-04-05 | Discharge: 2023-04-05 | Payer: MEDICARE

## 2023-04-05 ENCOUNTER — Ambulatory Visit
Admit: 2023-04-05 | Discharge: 2023-04-05 | Payer: MEDICARE | Attending: Nurse Practitioner | Primary: Nurse Practitioner

## 2023-04-05 ENCOUNTER — Other Ambulatory Visit: Admit: 2023-04-05 | Discharge: 2023-04-05 | Payer: MEDICARE

## 2023-04-05 ENCOUNTER — Encounter: Admit: 2023-04-05 | Discharge: 2023-04-05 | Payer: MEDICARE

## 2023-04-05 DIAGNOSIS — D469 Myelodysplastic syndrome, unspecified: Principal | ICD-10-CM

## 2023-04-05 DIAGNOSIS — D61818 Other pancytopenia: Principal | ICD-10-CM

## 2023-04-06 ENCOUNTER — Ambulatory Visit: Admit: 2023-04-06 | Discharge: 2023-04-06 | Payer: MEDICARE

## 2023-04-06 DIAGNOSIS — D61818 Other pancytopenia: Principal | ICD-10-CM

## 2023-04-06 DIAGNOSIS — D469 Myelodysplastic syndrome, unspecified: Principal | ICD-10-CM

## 2023-04-08 ENCOUNTER — Other Ambulatory Visit: Admit: 2023-04-08 | Discharge: 2023-04-08 | Payer: MEDICARE

## 2023-04-08 ENCOUNTER — Institutional Professional Consult (permissible substitution): Admit: 2023-04-08 | Discharge: 2023-04-08 | Payer: MEDICARE

## 2023-04-08 DIAGNOSIS — D469 Myelodysplastic syndrome, unspecified: Principal | ICD-10-CM

## 2023-04-08 DIAGNOSIS — D61818 Other pancytopenia: Principal | ICD-10-CM

## 2023-04-12 ENCOUNTER — Ambulatory Visit: Admit: 2023-04-12 | Discharge: 2023-04-12 | Payer: MEDICARE

## 2023-04-12 ENCOUNTER — Other Ambulatory Visit: Admit: 2023-04-12 | Discharge: 2023-04-12 | Payer: MEDICARE

## 2023-04-12 ENCOUNTER — Ambulatory Visit
Admit: 2023-04-12 | Discharge: 2023-04-12 | Payer: MEDICARE | Attending: Nurse Practitioner | Primary: Nurse Practitioner

## 2023-04-12 ENCOUNTER — Encounter
Admit: 2023-04-12 | Discharge: 2023-04-12 | Payer: MEDICARE | Attending: Nurse Practitioner | Primary: Nurse Practitioner

## 2023-04-12 DIAGNOSIS — D469 Myelodysplastic syndrome, unspecified: Principal | ICD-10-CM

## 2023-04-12 DIAGNOSIS — D61818 Other pancytopenia: Principal | ICD-10-CM

## 2023-04-13 ENCOUNTER — Ambulatory Visit: Admit: 2023-04-13 | Discharge: 2023-04-13 | Payer: MEDICARE

## 2023-04-13 DIAGNOSIS — D61818 Other pancytopenia: Principal | ICD-10-CM

## 2023-04-13 DIAGNOSIS — D469 Myelodysplastic syndrome, unspecified: Principal | ICD-10-CM

## 2023-04-15 ENCOUNTER — Institutional Professional Consult (permissible substitution): Admit: 2023-04-15 | Discharge: 2023-04-15 | Payer: MEDICARE

## 2023-04-15 ENCOUNTER — Other Ambulatory Visit: Admit: 2023-04-15 | Discharge: 2023-04-15 | Payer: MEDICARE

## 2023-04-15 DIAGNOSIS — D61818 Other pancytopenia: Principal | ICD-10-CM

## 2023-04-15 DIAGNOSIS — D469 Myelodysplastic syndrome, unspecified: Principal | ICD-10-CM

## 2023-04-16 ENCOUNTER — Ambulatory Visit: Admit: 2023-04-16 | Discharge: 2023-04-17 | Payer: MEDICARE

## 2023-04-16 DIAGNOSIS — D61818 Other pancytopenia: Principal | ICD-10-CM

## 2023-04-16 DIAGNOSIS — D469 Myelodysplastic syndrome, unspecified: Principal | ICD-10-CM

## 2023-04-19 ENCOUNTER — Other Ambulatory Visit: Admit: 2023-04-19 | Discharge: 2023-04-19 | Payer: MEDICARE

## 2023-04-19 ENCOUNTER — Ambulatory Visit: Admit: 2023-04-19 | Discharge: 2023-04-19 | Payer: MEDICARE

## 2023-04-19 DIAGNOSIS — D61818 Other pancytopenia: Principal | ICD-10-CM

## 2023-04-19 DIAGNOSIS — D469 Myelodysplastic syndrome, unspecified: Principal | ICD-10-CM

## 2023-04-19 DIAGNOSIS — A048 Other specified bacterial intestinal infections: Principal | ICD-10-CM

## 2023-04-19 MED ORDER — PANTOPRAZOLE 40 MG TABLET,DELAYED RELEASE
ORAL_TABLET | Freq: Every day | ORAL | 1 refills | 0 days
Start: 2023-04-19 — End: ?

## 2023-04-20 DIAGNOSIS — D469 Myelodysplastic syndrome, unspecified: Principal | ICD-10-CM

## 2023-04-20 MED ORDER — PANTOPRAZOLE 40 MG TABLET,DELAYED RELEASE
ORAL_TABLET | Freq: Every day | ORAL | 3 refills | 90 days | Status: CP
Start: 2023-04-20 — End: ?

## 2023-04-21 ENCOUNTER — Other Ambulatory Visit: Admit: 2023-04-21 | Discharge: 2023-04-21 | Payer: MEDICARE

## 2023-04-21 ENCOUNTER — Institutional Professional Consult (permissible substitution): Admit: 2023-04-21 | Discharge: 2023-04-21 | Payer: MEDICARE

## 2023-04-21 DIAGNOSIS — D469 Myelodysplastic syndrome, unspecified: Principal | ICD-10-CM

## 2023-04-21 DIAGNOSIS — D61818 Other pancytopenia: Principal | ICD-10-CM

## 2023-04-22 ENCOUNTER — Encounter: Admit: 2023-04-22 | Discharge: 2023-05-14 | Disposition: E | Payer: MEDICARE

## 2023-04-22 ENCOUNTER — Ambulatory Visit: Admit: 2023-04-22 | Discharge: 2023-05-14 | Disposition: E | Payer: MEDICARE

## 2023-04-22 ENCOUNTER — Ambulatory Visit: Admit: 2023-04-22 | Discharge: 2023-04-23 | Payer: MEDICARE

## 2023-04-22 ENCOUNTER — Encounter: Admit: 2023-04-22 | Discharge: 2023-05-14 | Disposition: E | Payer: MEDICARE | Attending: Anesthesiology

## 2023-04-22 ENCOUNTER — Ambulatory Visit: Admit: 2023-04-22 | Discharge: 2023-04-23 | Payer: MEDICARE | Attending: Adult Health | Primary: Adult Health

## 2023-04-22 ENCOUNTER — Institutional Professional Consult (permissible substitution): Admit: 2023-04-22 | Discharge: 2023-04-23 | Payer: MEDICARE

## 2023-04-22 ENCOUNTER — Ambulatory Visit: Admit: 2023-04-22 | Discharge: 2023-04-29 | Payer: MEDICARE

## 2023-04-22 ENCOUNTER — Encounter: Admit: 2023-04-22 | Discharge: 2023-04-23 | Payer: MEDICARE | Attending: Adult Health | Primary: Adult Health

## 2023-04-22 DIAGNOSIS — R0789 Other chest pain: Principal | ICD-10-CM

## 2023-04-22 DIAGNOSIS — D469 Myelodysplastic syndrome, unspecified: Principal | ICD-10-CM

## 2023-05-14 DEATH — deceased
